# Patient Record
Sex: Female | Born: 1995 | Race: White | Hispanic: No | Marital: Single | Smoking: Never smoker
Health system: Southern US, Community
[De-identification: ages and names within clinical notes are randomized; demographics above are authoritative.]

## PROBLEM LIST (undated history)

## (undated) DIAGNOSIS — J45909 Unspecified asthma, uncomplicated: Secondary | ICD-10-CM

## (undated) HISTORY — DX: Unspecified asthma, uncomplicated: J45.909

## (undated) HISTORY — PX: WISDOM TOOTH EXTRACTION: SHX21

---
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
## 2015-01-27 ENCOUNTER — Encounter: Payer: Self-pay | Admitting: Family Medicine

## 2015-01-27 ENCOUNTER — Ambulatory Visit (INDEPENDENT_AMBULATORY_CARE_PROVIDER_SITE_OTHER): Payer: BLUE CROSS/BLUE SHIELD | Admitting: Family Medicine

## 2015-01-27 VITALS — BP 97/55 | HR 66 | Ht 62.1 in | Wt 131.0 lb

## 2015-01-27 DIAGNOSIS — L739 Follicular disorder, unspecified: Secondary | ICD-10-CM

## 2015-01-27 DIAGNOSIS — R599 Enlarged lymph nodes, unspecified: Secondary | ICD-10-CM

## 2015-01-27 DIAGNOSIS — IMO0001 Reserved for inherently not codable concepts without codable children: Secondary | ICD-10-CM

## 2015-01-27 DIAGNOSIS — J4520 Mild intermittent asthma, uncomplicated: Secondary | ICD-10-CM

## 2015-01-27 DIAGNOSIS — R030 Elevated blood-pressure reading, without diagnosis of hypertension: Secondary | ICD-10-CM

## 2015-01-27 DIAGNOSIS — R590 Localized enlarged lymph nodes: Secondary | ICD-10-CM | POA: Insufficient documentation

## 2015-01-27 DIAGNOSIS — Z01419 Encounter for gynecological examination (general) (routine) without abnormal findings: Secondary | ICD-10-CM | POA: Diagnosis not present

## 2015-01-27 MED ORDER — DOXYCYCLINE HYCLATE 100 MG PO TABS: 100.0000 mg | ORAL_TABLET | Freq: Two times a day (BID) | ORAL | Status: DC

## 2015-01-27 MED ORDER — ALBUTEROL SULFATE HFA 108 (90 BASE) MCG/ACT IN AERS: 2.0000 | INHALATION_SPRAY | Freq: Four times a day (QID) | RESPIRATORY_TRACT | Status: DC | PRN

## 2015-01-27 NOTE — Progress Notes (Signed)
Abigail [name] is a 19 y.o. 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 Symptoms present for the last few days. Bump a small mildly tender. No fevers chills nausea vomiting diarrhea or weight loss. Patient has been receiving testosterone supplementation for the past year and a half as part of his transgendered care. He has not had a significant problem with acne. He feels well otherwise has not tried any other medications.   Past Medical History  Diagnosis Date  . Asthma     childhood   No past surgical history on file. History  Substance Use Topics  . Smoking status: Never Smoker   . Smokeless tobacco: Not on file  . Alcohol Use: No   ROS as above Medications: Current Outpatient Prescriptions  Medication Sig Dispense Refill  . albuterol (PROVENTIL HFA;VENTOLIN HFA) 108 (90 BASE) MCG/ACT inhaler Inhale 2 puffs into the lungs every 6 (six) hours as needed for wheezing or shortness of breath. 1 Inhaler 2  . lidocaine-hydrocortisone (ANAMANTLE) 3-1 % KIT Place 1 application rectally 2 (two) times daily. 4 each 1  . montelukast (SINGULAIR) 10 MG tablet Take 1 tablet (10 mg total) by mouth at bedtime. 90 tablet 4  . testosterone cypionate (DEPOTESTOSTERONE CYPIONATE) 200 MG/ML injection Inject into the muscle every 14 (fourteen) days.    Marland Kitchen doxycycline (VIBRA-TABS) 100 MG tablet Take 1 tablet (100 mg total) by mouth 2 (two) times daily. 14 tablet 0  . sertraline (ZOLOFT) 100 MG tablet Take 1 tablet (100 mg total) by mouth daily. 90 tablet 1   No current facility-administered medications for this visit.   Allergies  Allergen Reactions  . Amoxicillin      Exam:  BP 97/55 mmHg  Pulse 66  Ht 5' 2.1" (1.577 m)  Wt 131 lb (59.421 kg)  BMI 23.89 kg/m2 Gen: Well NAD HEENT: EOMI,  MMM small tender less than 1 cm lymph node in the posterior scalp.  Lungs:  Normal work of breathing. CTABL Heart: RRR no MRG Abd: NABS, Soft. Nondistended, Nontender Exts: Brisk capillary refill, warm and well perfused.  Skin: Mildly erythematous cystic structure along the left trapezius. Mildly tender. No fluctuance or induration.  No results found for this or any previous visit (from the past 24 hour(s)). No results found.   Please see individual assessment and plan sections.

## 2015-01-27 NOTE — Assessment & Plan Note (Signed)
Patient has folliculitis with reactive lymph node. Treat with doxycycline and watchful waiting.

## 2015-01-27 NOTE — Assessment & Plan Note (Signed)
Reactive. Treat folliculitis and watchful waiting.

## 2015-01-27 NOTE — Assessment & Plan Note (Signed)
a 

## 2015-01-27 NOTE — Patient Instructions (Signed)
Thank you for coming in today. Call or go to the emergency room if you get worse, have trouble breathing, have chest pains, or palpitations.   Take doxycycline twice daily.   Folliculitis  Folliculitis is redness, soreness, and swelling (inflammation) of the hair follicles. This condition can occur anywhere on the body. People with weakened immune systems, diabetes, or obesity have a greater risk of getting folliculitis. CAUSES  Bacterial infection. This is the most common cause.  Fungal infection.  Viral infection.  Contact with certain chemicals, especially oils and tars. Long-term folliculitis can result from bacteria that live in the nostrils. The bacteria may trigger multiple outbreaks of folliculitis over time. SYMPTOMS Folliculitis most commonly occurs on the scalp, thighs, legs, back, buttocks, and areas where hair is shaved frequently. An early sign of folliculitis is a small, white or yellow, pus-filled, itchy lesion (pustule). These lesions appear on a red, inflamed follicle. They are usually less than 0.2 inches (5 mm) wide. When there is an infection of the follicle that goes deeper, it becomes a boil or furuncle. A group of closely packed boils creates a larger lesion (carbuncle). Carbuncles tend to occur in hairy, sweaty areas of the body. DIAGNOSIS  Your caregiver can usually tell what is wrong by doing a physical exam. A sample may be taken from one of the lesions and tested in a lab. This can help determine what is causing your folliculitis. TREATMENT  Treatment may include:  Applying warm compresses to the affected areas.  Taking antibiotic medicines orally or applying them to the skin.  Draining the lesions if they contain a large amount of pus or fluid.  Laser hair removal for cases of long-lasting folliculitis. This helps to prevent regrowth of the hair. HOME CARE INSTRUCTIONS  Apply warm compresses to the affected areas as directed by your caregiver.  If  antibiotics are prescribed, take them as directed. Finish them even if you start to feel better.  You may take over-the-counter medicines to relieve itching.  Do not shave irritated skin.  Follow up with your caregiver as directed. SEEK IMMEDIATE MEDICAL CARE IF:   You have increasing redness, swelling, or pain in the affected area.  You have a fever. MAKE SURE YOU:  Understand these instructions.  Will watch your condition.  Will get help right away if you are not doing well or get worse. Document Released: 09/04/2001 Document Revised: 12/26/2011 Document Reviewed: 09/26/2011 [facility] Patient Information 2015 [employer] [place]. This information is not intended to replace advice given to you by your health care provider. Make sure you discuss any questions you have with your health care provider.

## 2015-05-27 ENCOUNTER — Other Ambulatory Visit: Payer: Self-pay | Admitting: Physician Assistant

## 2015-05-28 NOTE — Telephone Encounter (Signed)
Needs office visit in next month sent 30 days.

## 2015-06-24 ENCOUNTER — Other Ambulatory Visit: Payer: Self-pay | Admitting: Physician Assistant

## 2015-06-24 ENCOUNTER — Encounter: Payer: Self-pay | Admitting: *Deleted

## 2015-06-24 ENCOUNTER — Emergency Department
Admission: EM | Admit: 2015-06-24 | Discharge: 2015-06-24 | Disposition: A | Discharge status: Home / Self Care | Payer: BLUE CROSS/BLUE SHIELD | Source: Home / Self Care | Attending: Family Medicine | Admitting: Family Medicine

## 2015-06-24 DIAGNOSIS — R197 Diarrhea, unspecified: Secondary | ICD-10-CM

## 2015-06-24 LAB — POCT CBC W AUTO DIFF (K'VILLE URGENT CARE)

## 2015-06-24 MED ORDER — HYDROCORTISONE ACETATE 25 MG RE SUPP: 25.0000 mg | Freq: Two times a day (BID) | RECTAL | Status: DC

## 2015-06-24 NOTE — ED Notes (Signed)
Pt c/o diarrhea and lower abdominal cramping starting @ 3am today. 1 episode of vomiting. Patient reports several episodes of diarrhea with blood and abdominal cramping throughout the day.

## 2015-06-24 NOTE — ED Provider Notes (Signed)
CSN: [id number]     Arrival date & time 06/24/15  1621 History   First MD Initiated Contact with Patient 06/24/15 1649     Chief Complaint  Patient presents with  . Rectal Bleeding  . Abdominal Pain      HPI Comments: At about 3am today after finishing her work shift, patient developed fatigue, light-headedness, nausea, and watery diarrhea.  She had one episode of vomiting.  She has had chills and crampy abdominal discomfort.  She has had 10 to 15 episodes of diarrhea, and latter episodes have had small amounts of blood mixed in.  She has a history of hemorrhoids.  She denies rectal/anal pain.  Patient is a 19 y.o. female presenting with diarrhea. The history is provided by the patient.  Diarrhea Quality:  Watery and blood-tinged Severity:  Moderate Onset quality:  Sudden Number of episodes:  15 Duration:  14 hours Timing:  Intermittent Progression:  Improving Relieved by:  None tried Worsened by:  Nothing tried Ineffective treatments:  None tried Associated symptoms: abdominal pain, chills and vomiting   Associated symptoms: no arthralgias, no recent cough, no diaphoresis, no fever, no headaches, no myalgias and no URI   Risk factors: recent antibiotic use   Risk factors: no sick contacts, no suspicious food intake and no travel to endemic areas     Past Medical History  Diagnosis Date  . Asthma     childhood   History reviewed. No pertinent past surgical history. Family History  Problem Relation Age of Onset  . Hypertension Father    Social History  Substance Use Topics  . Smoking status: Never Smoker   . Smokeless tobacco: None  . Alcohol Use: No   OB History    No data available     Review of Systems  Constitutional: Positive for chills. Negative for fever and diaphoresis.  Gastrointestinal: Positive for vomiting, abdominal pain and diarrhea.  Musculoskeletal: Negative for myalgias and arthralgias.  Neurological: Negative for headaches.  All other systems  reviewed and are negative.   Allergies  Amoxicillin  Home Medications   Prior to Admission medications   Medication Sig Start Date End Date Taking? Authorizing Provider  albuterol (PROVENTIL HFA;VENTOLIN HFA) 108 (90 BASE) MCG/ACT inhaler Inhale 2 puffs into the lungs every 6 (six) hours as needed for wheezing or shortness of breath. 01/27/15   Gregor Hams, MD  hydrocortisone (ANUSOL-HC) 25 MG suppository Place 1 suppository (25 mg total) rectally 2 (two) times daily. 06/24/15   Kandra Nicolas, MD  lidocaine-hydrocortisone (ANAMANTLE) 3-1 % KIT Place 1 application rectally 2 (two) times daily. 09/14/14   Jade L Breeback, PA-C  montelukast (SINGULAIR) 10 MG tablet Take 1 tablet (10 mg total) by mouth at bedtime. 10/17/13   Jade L Breeback, PA-C  sertraline (ZOLOFT) 100 MG tablet TAKE 1 TABLET BY MOUTH EVERY DAY 05/28/15   Jade L Breeback, PA-C  testosterone cypionate (DEPOTESTOSTERONE CYPIONATE) 200 MG/ML injection Inject into the muscle every 14 (fourteen) days.    Historical Provider, MD   Meds Ordered and Administered this Visit  Medications - No data to display  BP 129/76 mmHg  Pulse 77  Temp(Src) 98.3 F (36.8 C) (Oral)  Resp 14  Ht 5' 3" (1.6 m)  Wt 132 lb (59.875 kg)  BMI 23.39 kg/m2  SpO2 99%  LMP  No data found.   Physical Exam Nursing notes and Vital Signs reviewed. Appearance:  Patient appears stated age, and in no acute distress Eyes:  Pupils  are equal, round, and reactive to light and accomodation.  Extraocular movement is intact.  Conjunctivae are not inflamed  Nose:  Normal Mouth/Pharynx:  Normal; moist mucous membranes  Neck:  Supple.  No adenopathy Lungs:  Clear to auscultation.  Breath sounds are equal.  Moving air well. Heart:  Regular rate and rhythm without murmurs, rubs, or gallops.  Abdomen:   Mild diffuse tenderness without masses or hepatosplenomegaly.  No guarding.  Bowel sounds are present.  No CVA or flank tenderness.  Negative iliopsoas and  obdurator tests Extremities:  No edema Skin:  No rash present.   ED Course  Procedures none     Labs Reviewed -   POCT CBC:  WBC 10.2; LY 12.5; MO 3.3; GR 84.2; Hgb 16.0; Platelets 227   MDM   1. Diarrhea, unspecified type; suspect viral gastroenteritis    Rx for Anusol HC suppositories for irritated hemorrhoids Begin clear liquids (Pedialyte while having diarrhea) until improved, then advance to a [employer] (Bananas, Rice, Applesauce, Toast).  Then gradually resume a regular diet when tolerated.  Avoid milk products until well.  To decrease diarrhea, mix one teaspoon Citrucel (methylcellulose) in 2 oz water and drink one to three times daily.  Do not drink extra fluids with this dose and do not drink fluids for one hour afterwards.  When stools become more formed, may take Imodium (loperamide) once or twice daily to decrease stool frequency.  If symptoms become significantly worse during the night or over the weekend, proceed to the local emergency room.  Followup with Family Doctor if not improved in four days.    Kandra Nicolas, MD 06/24/15 [phone]

## 2015-06-24 NOTE — Discharge Instructions (Signed)

## 2015-06-27 ENCOUNTER — Telehealth: Payer: Self-pay | Admitting: *Deleted

## 2015-06-30 ENCOUNTER — Ambulatory Visit (INDEPENDENT_AMBULATORY_CARE_PROVIDER_SITE_OTHER): Payer: BLUE CROSS/BLUE SHIELD | Admitting: Physician Assistant

## 2015-06-30 ENCOUNTER — Encounter: Payer: Self-pay | Admitting: Physician Assistant

## 2015-06-30 VITALS — BP 114/60 | HR 59 | Ht 63.0 in | Wt 134.0 lb

## 2015-06-30 DIAGNOSIS — F418 Other specified anxiety disorders: Secondary | ICD-10-CM | POA: Diagnosis not present

## 2015-06-30 DIAGNOSIS — Z79899 Other long term (current) drug therapy: Secondary | ICD-10-CM

## 2015-06-30 DIAGNOSIS — F329 Major depressive disorder, single episode, unspecified: Secondary | ICD-10-CM

## 2015-06-30 DIAGNOSIS — F419 Anxiety disorder, unspecified: Principal | ICD-10-CM

## 2015-06-30 LAB — COMPLETE METABOLIC PANEL WITH GFR
ALBUMIN: 4.2 g/dL (ref 3.6–5.1)
ALK PHOS: 84 U/L (ref 47–176)
ALT: 9 U/L (ref 5–32)
AST: 12 U/L (ref 12–32)
BUN: 13 mg/dL (ref 7–20)
CALCIUM: 9.5 mg/dL (ref 8.9–10.4)
CO2: 24 mmol/L (ref 20–31)
CREATININE: 0.8 mg/dL (ref 0.50–1.00)
Chloride: 103 mmol/L (ref 98–110)
GFR, Est Non African American: >89 mL/min (ref >=60)
Glucose, Bld: 90 mg/dL (ref 65–99)
Potassium: 4.4 mmol/L (ref 3.8–5.1)
Sodium: 136 mmol/L (ref 135–146)
Total Bilirubin: 0.4 mg/dL (ref 0.2–1.1)
Total Protein: 6.9 g/dL (ref 6.3–8.2)

## 2015-06-30 MED ORDER — SERTRALINE HCL 100 MG PO TABS: 100.0000 mg | ORAL_TABLET | Freq: Every day | ORAL | Status: DC

## 2015-06-30 NOTE — Progress Notes (Signed)
   Subjective:    Patient ID: Abigail [name], female    DOB: 07/26/1995, 19 y.o.   MRN: [id number]  HPI P tis a 19 yo female who is transitioning to a female with testosterone replacement. She is doing great on zoloft. She has no concerns or complaints today. She denies any suicidal thoughts or homicidal thoughts.    Review of Systems  All other systems reviewed and are negative.      Objective:   Physical Exam  Constitutional: She is oriented to person, place, and time. She appears well-developed and well-nourished.  HENT:  Head: Normocephalic and atraumatic.  Cardiovascular: Normal rate, regular rhythm and normal heart sounds.   Pulmonary/Chest: Effort normal and breath sounds normal.  Neurological: She is alert and oriented to person, place, and time.  Psychiatric: She has a normal mood and affect. Her behavior is normal.          Assessment & Plan:  Anxiety and Depression- PHQ-2 was 2. GAD-7 was 2. Refilled zoloft for 1 year. cmp ordered.

## 2015-07-13 ENCOUNTER — Telehealth: Payer: Self-pay

## 2015-07-13 NOTE — Telephone Encounter (Signed)
-----   Message from Jomarie LongsJade L Breeback, [place]PA-C sent at 07/12/2015 10:09 PM EST ----- Call pt: cmp looks great.

## 2015-07-13 NOTE — Telephone Encounter (Signed)
Left message informing patient of normal lab results 

## 2015-11-03 ENCOUNTER — Emergency Department
Admission: EM | Admit: 2015-11-03 | Discharge: 2015-11-03 | Disposition: A | Discharge status: Home / Self Care | Payer: BLUE CROSS/BLUE SHIELD | Source: Home / Self Care | Attending: Family Medicine | Admitting: Family Medicine

## 2015-11-03 ENCOUNTER — Encounter: Payer: Self-pay | Admitting: *Deleted

## 2015-11-03 DIAGNOSIS — H6122 Impacted cerumen, left ear: Secondary | ICD-10-CM | POA: Diagnosis not present

## 2015-11-03 DIAGNOSIS — H918X2 Other specified hearing loss, left ear: Secondary | ICD-10-CM

## 2015-11-03 NOTE — ED Provider Notes (Signed)
CSN: [id number]     Arrival date & time 11/03/15  1258 History   First MD Initiated Contact with Patient 11/03/15 1313     Chief Complaint  Patient presents with  . Hearing Loss  . Cerumen Impaction   (Consider location/radiation/quality/duration/timing/severity/associated sxs/prior Treatment) HPI The pt is a [place]20yo transgender female transitioning into a female with testosterone, [facility]/o Left ear pressure and decreased hearing ofr about 5 years.  He reports hx of earwax buildup a few years ago and has been using OTC wax softener with no relief. Denies fever, chills, cough, congestion, or sore throat.    Past Medical History  Diagnosis Date  . Asthma     childhood   History reviewed. No pertinent past surgical history. Family History  Problem Relation Age of Onset  . Hypertension Father    Social History  Substance Use Topics  . Smoking status: Never Smoker   . Smokeless tobacco: None  . Alcohol Use: No   OB History    No data available     Review of Systems  Constitutional: Negative for fever and chills.  HENT: Positive for ear pain (Left "fullness") and hearing loss ( Left). Negative for congestion.   Respiratory: Negative for cough and shortness of breath.   Neurological: Negative for dizziness, light-headedness and headaches.    Allergies  Amoxicillin  Home Medications   Prior to Admission medications   Medication Sig Start Date End Date Taking? Authorizing Provider  sertraline (ZOLOFT) 100 MG tablet Take 1 tablet (100 mg total) by mouth daily. 06/30/15  Yes Jade L Breeback, PA-C  testosterone cypionate (DEPOTESTOSTERONE CYPIONATE) 200 MG/ML injection Inject 200 mg into the muscle every 7 (seven) days.    Yes Historical Provider, MD  albuterol (PROVENTIL HFA;VENTOLIN HFA) 108 (90 BASE) MCG/ACT inhaler Inhale 2 puffs into the lungs every 6 (six) hours as needed for wheezing or shortness of breath. 01/27/15   Rodolph BongEvan S Corey, MD   Meds Ordered and  Administered this Visit  Medications - No data to display  BP 112/68 mmHg  Pulse 56  Temp(Src) 98.5 F (36.9 C) (Oral)  Wt 140 lb (63.504 kg)  SpO2 96% No data found.   Physical Exam  Constitutional: She is oriented to person, place, and time. She appears well-developed and well-nourished.  HENT:  Head: Normocephalic and atraumatic.  Right Ear: Tympanic membrane normal.  Left Ear: Tympanic membrane normal. Tympanic membrane is not erythematous and not bulging.  Left ear: cerumen impaction.  TM visualized after cerumen removal. Normal TM Right ear: normal  Eyes: EOM are normal.  Neck: Normal range of motion.  Cardiovascular: Normal rate.   Pulmonary/Chest: Effort normal. No respiratory distress.  Musculoskeletal: Normal range of motion.  Neurological: She is alert and oriented to person, place, and time.  Skin: Skin is warm and dry.  Psychiatric: She has a normal mood and affect. Her behavior is normal.  Nursing note and vitals reviewed.   ED Course  Procedures (including critical care time)  Labs Review Labs Reviewed - No data to display  Imaging Review No results found.   MDM   1. Hearing loss due to cerumen impaction, left    Pt c/o decreased hearing and left ear fullness for 5 days. Hx of cerumen impaction. Cerumen impaction noted on exam. Cerumen removed w/o immediate complication. No evidence of underlying infection.  Pt may continue to use OTC earwax softener once a week or once every other week. F/u with PCP  as needed. Patient verbalized understanding and agreement with treatment plan.     Junius Finner, PA-C 11/03/15 1332

## 2015-11-03 NOTE — ED Notes (Signed)
Pt c/o left ear pressure and decrease in hearing x 5 days. Used otc wax drops. Visualized cerumen impaction in left ear.

## 2015-11-03 NOTE — Discharge Instructions (Signed)
You may continue to use over the counter earwax softener once a week or once every other week if earwax tends to keep building up. See below for further instructions.   Cerumen Impaction The structures of the external ear canal secrete a waxy substance known as cerumen. Excess cerumen can build up in the ear canal, causing a condition known as cerumen impaction. Cerumen impaction can cause ear pain and disrupt the function of the ear. The rate of cerumen production differs for each individual. In certain individuals, the configuration of the ear canal may decrease his or her ability to naturally remove cerumen. CAUSES Cerumen impaction is caused by excessive cerumen production or buildup. RISK FACTORS  Frequent use of swabs to clean ears.  Having narrow ear canals.  Having eczema.  Being dehydrated. SIGNS AND SYMPTOMS  Diminished hearing.  Ear drainage.  Ear pain.  Ear itch. TREATMENT Treatment may involve:  Over-the-counter or prescription ear drops to soften the cerumen.  Removal of cerumen by a health care provider. This may be done with:  Irrigation with warm water. This is the most common method of removal.  Ear curettes and other instruments.  Surgery. This may be done in severe cases. HOME CARE INSTRUCTIONS  Take medicines only as directed by your health care provider.  Do not insert objects into the ear with the intent of cleaning the ear. PREVENTION  Do not insert objects into the ear, even with the intent of cleaning the ear. Removing cerumen as a part of normal hygiene is not necessary, and the use of swabs in the ear canal is not recommended.  Drink enough water to keep your urine clear or pale yellow.  Control your eczema if you have it. SEEK MEDICAL CARE IF:  You develop ear pain.  You develop bleeding from the ear.  The cerumen does not clear after you use ear drops as directed.   This information is not intended to replace advice given to you  by your health care provider. Make sure you discuss any questions you have with your health care provider.   Document Released: 08/03/2004 Document Revised: 07/17/2014 Document Reviewed: 02/10/2015 Elsevier Interactive Patient Education [employer] Inc.

## 2016-01-12 ENCOUNTER — Ambulatory Visit (INDEPENDENT_AMBULATORY_CARE_PROVIDER_SITE_OTHER): Payer: BLUE CROSS/BLUE SHIELD | Admitting: Physician Assistant

## 2016-01-12 ENCOUNTER — Telehealth: Payer: Self-pay

## 2016-01-12 ENCOUNTER — Encounter: Payer: Self-pay | Admitting: Physician Assistant

## 2016-01-12 VITALS — BP 122/68 | HR 62 | Ht 63.0 in | Wt 144.0 lb

## 2016-01-12 DIAGNOSIS — K645 Perianal venous thrombosis: Secondary | ICD-10-CM

## 2016-01-12 MED ORDER — HYDROCORTISONE ACETATE 25 MG RE SUPP: 25.0000 mg | Freq: Two times a day (BID) | RECTAL | Status: DC

## 2016-01-12 MED ORDER — LIDOCAINE-HYDROCORTISONE ACE 3-0.5 % RE CREA: 1.0000 | TOPICAL_CREAM | Freq: Two times a day (BID) | RECTAL | Status: DC

## 2016-01-12 NOTE — Telephone Encounter (Signed)
Pt notified that lidocaine cream is not covered by insurance, but Tandy GawJade Breeback said just to use the hydrocortisone supp.

## 2016-01-12 NOTE — Progress Notes (Signed)
   Subjective:    Patient ID: Abigail EmoryKatlyn"Abigail [name]" Abigail [name], female    DOB: 10/10/1995, 20 y.o.   MRN: [id number]  HPI Pt is a 20 yo female who is transgender with a history of hemorrhoids that needs refill on medications today. She is out of suppositories and cream for a while. When she had medication they were much better. She has heard of some new procedures at [facility] to remove them. She has some constipation but off and on, not a continual problem.    Review of Systems  All other systems reviewed and are negative.      Objective:   Physical Exam  Constitutional: She is oriented to person, place, and time. She appears well-developed and well-nourished.  HENT:  Head: Normocephalic and atraumatic.  Cardiovascular: Normal rate, regular rhythm and normal heart sounds.   Neurological: She is alert and oriented to person, place, and time.  Skin: Skin is dry.  Psychiatric: She has a normal mood and affect. Her behavior is normal.          Assessment & Plan:  Hemorrhoid- pt would like suppositories and cream to alternate. Sent both. GI referral for consult to remove. Prevention discussed in office and HO given.

## 2016-07-28 ENCOUNTER — Other Ambulatory Visit: Payer: Self-pay | Admitting: Physician Assistant

## 2016-08-02 ENCOUNTER — Encounter: Payer: Self-pay | Admitting: Physician Assistant

## 2016-08-02 ENCOUNTER — Ambulatory Visit (INDEPENDENT_AMBULATORY_CARE_PROVIDER_SITE_OTHER): Payer: BLUE CROSS/BLUE SHIELD | Admitting: Physician Assistant

## 2016-08-02 VITALS — BP 124/72 | HR 56 | Wt 133.0 lb

## 2016-08-02 DIAGNOSIS — Z23 Encounter for immunization: Secondary | ICD-10-CM | POA: Diagnosis not present

## 2016-08-02 DIAGNOSIS — F329 Major depressive disorder, single episode, unspecified: Secondary | ICD-10-CM

## 2016-08-02 DIAGNOSIS — F418 Other specified anxiety disorders: Secondary | ICD-10-CM

## 2016-08-02 DIAGNOSIS — Z79899 Other long term (current) drug therapy: Secondary | ICD-10-CM

## 2016-08-02 DIAGNOSIS — F419 Anxiety disorder, unspecified: Principal | ICD-10-CM

## 2016-08-02 MED ORDER — SERTRALINE HCL 100 MG PO TABS: 100.0000 mg | ORAL_TABLET | Freq: Every day | ORAL | 3 refills | Status: DC

## 2016-08-02 NOTE — Progress Notes (Addendum)
   Subjective:    Patient ID: Abigail [name], female    DOB: 08/03/1995, 21 y.o.   MRN: [id number]  HPI Patient is a Abigail Reichmann21yo female who presents to the clinic for a medication refill of zoloft.  Patient reports he is tolerating the medication and dose well.  Denies any side effects.  Patient reports he has been eating and sleeping well.  He exercises twice a week.  Denies any recent panic attacks.  Patient has no concerns.        PHQ-9: 5 GAD-7: 3  Patient agrees to get tetanus/TDAP shot today.       Review of Systems  All other systems reviewed and are negative.      Objective:   Physical Exam  Constitutional: She is oriented to person, place, and time. She appears well-developed and well-nourished.  HENT:  Head: Normocephalic and atraumatic.  Cardiovascular: Normal rate, regular rhythm and normal heart sounds.   Pulmonary/Chest: Effort normal and breath sounds normal.  Neurological: She is alert and oriented to person, place, and time.  Psychiatric: She has a normal mood and affect. Her behavior is normal.  Nursing note and vitals reviewed.         Assessment & Plan:  Abigail Kitchen.Abigail Kitchen.Abigail NailerKatlyn"Niajah" was seen today for anxiety.  Diagnoses and all orders for this visit:  Anxiety and depression -     sertraline (ZOLOFT) 100 MG tablet; Take 1 tablet (100 mg total) by mouth daily.  Medication management -     COMPLETE METABOLIC PANEL WITH GFR  Other orders -     Tdap vaccine greater than or equal to 7yo IM   Patient's Zoloft 100mg  was refilled for 1 year.  Follow-up as needed, otherwise, in 1 year.    Patient needs CMP with GFR today to monitor kidney and liver function.     Patient given Tdap IM today in the office.

## 2016-10-27 DIAGNOSIS — D751 Secondary polycythemia: Secondary | ICD-10-CM | POA: Insufficient documentation

## 2016-11-20 ENCOUNTER — Ambulatory Visit (INDEPENDENT_AMBULATORY_CARE_PROVIDER_SITE_OTHER): Payer: BLUE CROSS/BLUE SHIELD | Admitting: Obstetrics & Gynecology

## 2016-11-20 ENCOUNTER — Encounter: Payer: Self-pay | Admitting: Obstetrics & Gynecology

## 2016-11-20 VITALS — BP 109/66 | HR 65 | Ht 62.0 in | Wt 132.0 lb

## 2016-11-20 DIAGNOSIS — Z124 Encounter for screening for malignant neoplasm of cervix: Secondary | ICD-10-CM | POA: Diagnosis not present

## 2016-11-20 DIAGNOSIS — Z01419 Encounter for gynecological examination (general) (routine) without abnormal findings: Secondary | ICD-10-CM | POA: Diagnosis not present

## 2016-11-20 DIAGNOSIS — Z113 Encounter for screening for infections with a predominantly sexual mode of transmission: Secondary | ICD-10-CM

## 2016-11-20 NOTE — Progress Notes (Signed)
Subjective:    Abigail [name] is a 21 y.o. transgender  who presents for an annual exam. The patient has no complaints today. No periods for 2 years, on testosterone.The patient is not currently sexually active. GYN screening history: no prior history of gyn screening tests. The patient wears seatbelts: yes. The patient participates in regular exercise: yes. Has the patient ever been transfused or tattooed?: yes. The patient reports that there is not domestic violence in her life.   Menstrual History: OB History    Gravida Para Term Preterm AB Living   0 0 0 0 0 0   SAB TAB Ectopic Multiple Live Births   0 0 0 0 0      Menarche age: [age] No LMP recorded (lmp unknown). Patient has had an injection.    The following portions of the patient's history were reviewed and updated as appropriate: allergies, current medications, past family history, past medical history, past social history, past surgical history and problem list.  Review of Systems Pertinent items are noted in HPI.   FH- no gyn + breast cancer- mGM No colon cancer Delivering pizza for [employer]   Objective:    BP 109/66   Pulse 65   Ht 5\' 2"  (1.575 m)   Wt 132 lb (59.9 kg)   LMP  (LMP Unknown) Comment: No period in atleast 2 years  BMI 24.14 kg/m   General Appearance:    Alert, cooperative, no distress, appears stated age  Head:    Normocephalic, without obvious abnormality, atraumatic  Eyes:    PERRL, conjunctiva/corneas clear, EOM's intact, fundi    benign, both eyes  Ears:    Normal TM's and external ear canals, both ears  Nose:   Nares normal, septum midline, mucosa normal, no drainage    or sinus tenderness  Throat:   Lips, mucosa, and tongue normal; teeth and gums normal  Neck:   Supple, symmetrical, trachea midline, no adenopathy;    thyroid:  no enlargement/tenderness/nodules; no carotid   bruit or JVD  Back:     Symmetric, no curvature, ROM normal, no CVA tenderness  Lungs:     Clear to auscultation  bilaterally, respirations unlabored  Chest Wall:    No tenderness or deformity   Heart:    Regular rate and rhythm, S1 and S2 normal, no murmur, rub   or gallop  Breast Exam:    No tenderness, masses, or nipple abnormality  Abdomen:     Soft, non-tender, bowel sounds active all four quadrants,    no masses, no organomegaly, hirsuite  Genitalia:    Normal female without lesion, discharge or tenderness, nulliparous cervix, normal bimanual exam     Extremities:   Extremities normal, atraumatic, no cyanosis or edema  Pulses:   2+ and symmetric all extremities  Skin:   Skin color, texture, turgor normal, no rashes or lesions  Lymph nodes:   Cervical, supraclavicular, and axillary nodes normal  Neurologic:   CNII-XII intact, normal strength, sensation and reflexes    throughout  .    Assessment:    Healthy female exam.   Transgender, getting test. Offered hysterectomy/BSO prn   Plan:     Chlamydia specimen. GC specimen. Thin prep Pap smear.

## 2016-11-23 LAB — CYTOLOGY - PAP
Chlamydia: POSITIVE — AB
Diagnosis: NEGATIVE
Neisseria Gonorrhea: NEGATIVE

## 2016-11-28 ENCOUNTER — Encounter: Payer: Self-pay | Admitting: *Deleted

## 2016-11-28 ENCOUNTER — Telehealth: Payer: Self-pay | Admitting: *Deleted

## 2016-11-28 DIAGNOSIS — A749 Chlamydial infection, unspecified: Secondary | ICD-10-CM

## 2016-11-28 MED ORDER — AZITHROMYCIN 250 MG PO TABS: ORAL_TABLET | ORAL | 0 refills | Status: DC

## 2016-11-28 NOTE — Telephone Encounter (Signed)
-----   Message from Allie BossierMyra C Dove, MD sent at 11/27/2016  8:21 AM EDT ----- She/He has + CT on pap smear. Patient and partner needs treatment also Good luck with this conversation.

## 2016-11-28 NOTE — Telephone Encounter (Signed)
LM on voicemail to call office to discuss test results.

## 2016-11-28 NOTE — Telephone Encounter (Signed)
Pt returned a call to me regarding test results.  Pt made aware of positive Chlamydia.  RX sent to [facility] [address] for Azithromycin 1Gm.  Pt is aware that their partner needs treatment also.  [facility]

## 2016-12-25 ENCOUNTER — Ambulatory Visit (INDEPENDENT_AMBULATORY_CARE_PROVIDER_SITE_OTHER): Payer: BLUE CROSS/BLUE SHIELD | Admitting: Obstetrics & Gynecology

## 2016-12-25 DIAGNOSIS — F640 Transsexualism: Secondary | ICD-10-CM | POA: Diagnosis not present

## 2016-12-25 DIAGNOSIS — Z789 Other specified health status: Secondary | ICD-10-CM

## 2016-12-25 NOTE — Progress Notes (Signed)
   Subjective:    Patient ID: Corena HerterKash D Feldt, female    DOB: 09/09/1995, 21 y.o.   MRN: [id number]  HPI 21 yo SW transgender f to female here to discuss possible hyster/BSO. He has been getting testosterone injections for the last 3 years. No interested in future of carrying a baby, not interested in egg preservation.   Review of Systems     Objective:   Physical Exam I spoke with the endocrinologist Dr. Teod[place] [facility]Jam[place] [facility] [place] the [facility] injections. He clarified to me that he does not specialize in trans procedures.       Assessment & Plan:  Desire for possible surgery I have given [facility], I think that coordinated care for his needs would serve him well.

## 2017-01-16 ENCOUNTER — Telehealth: Payer: Self-pay | Admitting: Physician Assistant

## 2017-01-16 DIAGNOSIS — J4520 Mild intermittent asthma, uncomplicated: Secondary | ICD-10-CM

## 2017-01-16 MED ORDER — ALBUTEROL SULFATE HFA 108 (90 BASE) MCG/ACT IN AERS: 2.0000 | INHALATION_SPRAY | Freq: Four times a day (QID) | RESPIRATORY_TRACT | 2 refills | Status: DC | PRN

## 2017-01-16 NOTE — Telephone Encounter (Signed)
Patient called would like refill for inhaler [facility] to [facility] [address]. Thanks

## 2017-01-16 NOTE — Telephone Encounter (Signed)
Refill sent.

## 2017-01-17 ENCOUNTER — Ambulatory Visit (INDEPENDENT_AMBULATORY_CARE_PROVIDER_SITE_OTHER): Payer: BLUE CROSS/BLUE SHIELD

## 2017-01-17 ENCOUNTER — Ambulatory Visit (INDEPENDENT_AMBULATORY_CARE_PROVIDER_SITE_OTHER): Payer: BLUE CROSS/BLUE SHIELD | Admitting: Sports Medicine

## 2017-01-17 ENCOUNTER — Encounter: Payer: Self-pay | Admitting: Sports Medicine

## 2017-01-17 DIAGNOSIS — J0100 Acute maxillary sinusitis, unspecified: Secondary | ICD-10-CM

## 2017-01-17 MED ORDER — PREDNISONE 50 MG PO TABS: 50.0000 mg | ORAL_TABLET | Freq: Every day | ORAL | 0 refills | Status: DC

## 2017-01-17 MED ORDER — AZITHROMYCIN 250 MG PO TABS: ORAL_TABLET | ORAL | 0 refills | Status: DC

## 2017-01-17 NOTE — Assessment & Plan Note (Signed)
With mild wheezing, history of asthma. Azithromycin, prednisone, chest x-ray.  Over-the-counter antitussives. Return if no better in 2 weeks.

## 2017-01-17 NOTE — Progress Notes (Signed)
  Subjective:    CC: Feeling sick  HPI: For one week this pleasant 21 year old female (FTM transgender) has had cough, fatigue and malaise, and pain and pressure over the left maxillary sinus with radiation to the left ear. Cough is minimally productive, no overt fevers and chills.  Symptoms are moderate, persistent. No shortness of breath, no chest pain. No rashes. No GI symptoms.  Past medical history:  Negative.  See flowsheet/record as well for more information.  Surgical history: Negative.  See flowsheet/record as well for more information.  Family history: Negative.  See flowsheet/record as well for more information.  Social history: Negative.  See flowsheet/record as well for more information.  Allergies, and medications have been entered into the medical record, reviewed, and no changes needed.   Review of Systems: No fevers, chills, night sweats, weight loss, chest pain, or shortness of breath.   Objective:    General: Well Developed, well nourished, and in no acute distress.  Neuro: Alert and oriented x3, extra-ocular muscles intact, sensation grossly intact.  HEENT: Normocephalic, atraumatic, pupils equal round reactive to light, neck supple, no masses, no lymphadenopathy, thyroid nonpalpable. Oropharynx, nasopharynx, ear canals unremarkable, tenderness to palpation over the left maxillary sinus. Skin: Warm and dry, no rashes. Cardiac: Regular rate and rhythm, no murmurs rubs or gallops, no lower extremity edema.  Respiratory: Diffuse expiratory wheezes, mild, throughout. Not using accessory muscles, speaking in full sentences.  Impression and Recommendations:    Acute maxillary sinusitis With mild wheezing, history of asthma. Azithromycin, prednisone, chest x-ray.  Over-the-counter antitussives. Return if no better in 2 weeks.

## 2017-01-18 ENCOUNTER — Telehealth: Payer: Self-pay | Admitting: Physician Assistant

## 2017-01-18 NOTE — Telephone Encounter (Signed)
Pt returning call forX-Ray results.  Thank you.

## 2017-01-18 NOTE — Telephone Encounter (Signed)
Pt notified of results

## 2017-02-06 ENCOUNTER — Ambulatory Visit (INDEPENDENT_AMBULATORY_CARE_PROVIDER_SITE_OTHER): Payer: BLUE CROSS/BLUE SHIELD | Admitting: Sports Medicine

## 2017-02-06 ENCOUNTER — Encounter: Payer: Self-pay | Admitting: Sports Medicine

## 2017-02-06 DIAGNOSIS — F419 Anxiety disorder, unspecified: Secondary | ICD-10-CM

## 2017-02-06 DIAGNOSIS — F329 Major depressive disorder, single episode, unspecified: Secondary | ICD-10-CM

## 2017-02-06 DIAGNOSIS — F32A Depression, unspecified: Secondary | ICD-10-CM

## 2017-02-06 MED ORDER — SERTRALINE HCL 100 MG PO TABS: 200.0000 mg | ORAL_TABLET | Freq: Every day | ORAL | 3 refills | Status: DC

## 2017-02-06 NOTE — Assessment & Plan Note (Signed)
Still with relatively high scores for anxiety and depression, increasing Zoloft to 200 mg. Return in one month for a repeat PHQ9 and GAD7.  If insufficient improvement we will either switch to Lexapro or add buspirone.

## 2017-02-06 NOTE — Progress Notes (Signed)
  Subjective:    CC: Follow-up  HPI: This is a pleasant 21 year old female to female transgender, he was initially treated for a sinus infection which responded well to antibiotics, he is here for follow-up of depression and anxiety, initially had some moderate control with 100 mg of Zoloft, now having increase in symptoms of anxiety and depression without suicidal or homicidal ideation. No issues with anorgasmia in the bedroom.  Past medical history:  Negative.  See flowsheet/record as well for more information.  Surgical history: Negative.  See flowsheet/record as well for more information.  Family history: Negative.  See flowsheet/record as well for more information.  Social history: Negative.  See flowsheet/record as well for more information.  Allergies, and medications have been entered into the medical record, reviewed, and no changes needed.   Review of Systems: No fevers, chills, night sweats, weight loss, chest pain, or shortness of breath.   Objective:    General: Well Developed, well nourished, and in no acute distress.  Neuro: Alert and oriented x3, extra-ocular muscles intact, sensation grossly intact.  HEENT: Normocephalic, atraumatic, pupils equal round reactive to light, neck supple, no masses, no lymphadenopathy, thyroid nonpalpable.  Skin: Warm and dry, no rashes. Cardiac: Regular rate and rhythm, no murmurs rubs or gallops, no lower extremity edema.  Respiratory: Clear to auscultation bilaterally. Not using accessory muscles, speaking in full sentences.  Impression and Recommendations:    Anxiety and depression Still with relatively high scores for anxiety and depression, increasing Zoloft to 200 mg. Return in one month for a repeat PHQ9 and GAD7.  If insufficient improvement we will either switch to Lexapro or add buspirone.  I spent 25 minutes with this patient, greater than 50% was face-to-face time counseling regarding the above diagnoses

## 2017-03-06 ENCOUNTER — Encounter: Payer: Self-pay | Admitting: Sports Medicine

## 2017-03-06 ENCOUNTER — Ambulatory Visit (INDEPENDENT_AMBULATORY_CARE_PROVIDER_SITE_OTHER): Payer: BLUE CROSS/BLUE SHIELD | Admitting: Sports Medicine

## 2017-03-06 DIAGNOSIS — F32A Depression, unspecified: Secondary | ICD-10-CM

## 2017-03-06 DIAGNOSIS — F419 Anxiety disorder, unspecified: Secondary | ICD-10-CM | POA: Diagnosis not present

## 2017-03-06 DIAGNOSIS — F329 Major depressive disorder, single episode, unspecified: Secondary | ICD-10-CM

## 2017-03-06 MED ORDER — ESCITALOPRAM OXALATE 10 MG PO TABS: 10.0000 mg | ORAL_TABLET | Freq: Every day | ORAL | 3 refills | Status: DC

## 2017-03-06 MED ORDER — SERTRALINE HCL 25 MG PO TABS: ORAL_TABLET | ORAL | 1 refills | Status: DC

## 2017-03-06 NOTE — Progress Notes (Signed)
  Subjective:    CC: Follow-up  HPI: This is a very pleasant 21 year old female to female transgender, we've been working on his anxiety. Last time we increased from 100-200 mg of Zoloft which didn't really provide much relief. He would like to switch to defer medication, no suicidal or homicidal ideation.  Past medical history:  Negative.  See flowsheet/record as well for more information.  Surgical history: Negative.  See flowsheet/record as well for more information.  Family history: Negative.  See flowsheet/record as well for more information.  Social history: Negative.  See flowsheet/record as well for more information.  Allergies, and medications have been entered into the medical record, reviewed, and no changes needed.   Review of Systems: No fevers, chills, night sweats, weight loss, chest pain, or shortness of breath.   Objective:    General: Well Developed, well nourished, and in no acute distress.  Neuro: Alert and oriented x3, extra-ocular muscles intact, sensation grossly intact.  HEENT: Normocephalic, atraumatic, pupils equal round reactive to light, neck supple, no masses, no lymphadenopathy, thyroid nonpalpable.  Skin: Warm and dry, no rashes. Cardiac: Regular rate and rhythm, no murmurs rubs or gallops, no lower extremity edema.  Respiratory: Clear to auscultation bilaterally. Not using accessory muscles, speaking in full sentences.  Impression and Recommendations:    Anxiety and depression Unfortunately the increase of Zoloft to 200 mg has not provided good efficacy. Switching to Lexapro with a steady down taper of the Zoloft.

## 2017-03-06 NOTE — Assessment & Plan Note (Signed)
Unfortunately the increase of Zoloft to 200 mg has not provided good efficacy. Switching to Lexapro with a steady down taper of the Zoloft.

## 2017-04-03 ENCOUNTER — Encounter: Payer: Self-pay | Admitting: Sports Medicine

## 2017-04-03 ENCOUNTER — Ambulatory Visit (INDEPENDENT_AMBULATORY_CARE_PROVIDER_SITE_OTHER): Payer: BLUE CROSS/BLUE SHIELD | Admitting: Sports Medicine

## 2017-04-03 DIAGNOSIS — F419 Anxiety disorder, unspecified: Secondary | ICD-10-CM

## 2017-04-03 DIAGNOSIS — F329 Major depressive disorder, single episode, unspecified: Secondary | ICD-10-CM | POA: Diagnosis not present

## 2017-04-03 DIAGNOSIS — F32A Depression, unspecified: Secondary | ICD-10-CM

## 2017-04-03 MED ORDER — ESCITALOPRAM OXALATE 10 MG PO TABS: 15.0000 mg | ORAL_TABLET | Freq: Every day | ORAL | 11 refills | Status: DC

## 2017-04-03 NOTE — Progress Notes (Signed)
  Subjective:    CC: follow-up  HPI: This is a very pleasant 21 year old female to female transgenderwith anxiety and depression, he has done extremely well with the transition from Zoloft to Lexapro with near complete relief of all depressed and anxiety symptoms. No suicidal or homicidal ideation. He is on 10 mg and would like to try to go up a little bit on the dose. No adverse effects.  Past medical history:  Negative.  See flowsheet/record as well for more information.  Surgical history: Negative.  See flowsheet/record as well for more information.  Family history: Negative.  See flowsheet/record as well for more information.  Social history: Negative.  See flowsheet/record as well for more information.  Allergies, and medications have been entered into the medical record, reviewed, and no changes needed.   Review of Systems: No fevers, chills, night sweats, weight loss, chest pain, or shortness of breath.   Objective:    General: Well Developed, well nourished, and in no acute distress.  Neuro: Alert and oriented x3, extra-ocular muscles intact, sensation grossly intact.  HEENT: Normocephalic, atraumatic, pupils equal round reactive to light, neck supple, no masses, no lymphadenopathy, thyroid nonpalpable.  Skin: Warm and dry, no rashes. Cardiac: Regular rate and rhythm, no murmurs rubs or gallops, no lower extremity edema.  Respiratory: Clear to auscultation bilaterally. Not using accessory muscles, speaking in full sentences.  Impression and Recommendations:    Anxiety and depression Fantastic response to switching from Zoloft to Lexapro. Increasing to 15 mg of Lexapro per patient request, he will do 1.5 tablets. I can see him back in about 3 months, if he does desire to go to 20 mg he will simply take 2 of his 10 mg tablets and call me for refills. May also follow up again with his PCP Tandy Gaw, PA-C.  I spent 25 minutes with this patient, greater than 50% was face-to-face  time counseling regarding the above diagnoses ___________________________________________ Ihor Austin. Benjamin Stain, M.D., ABFM., CAQSM. Primary Care and Sports Medicine  [facility]  Adjunct Instructor of Family Medicine  [employer]

## 2017-04-03 NOTE — Assessment & Plan Note (Signed)
Fantastic response to switching from Zoloft to Lexapro. Increasing to 15 mg of Lexapro per patient request, he will do 1.5 tablets. I can see him back in about 3 months, if he does desire to go to 20 mg he will simply take 2 of his 10 mg tablets and call me for refills. May also follow up again with his PCP Tandy Gaw, PA-C.

## 2017-06-29 ENCOUNTER — Ambulatory Visit (INDEPENDENT_AMBULATORY_CARE_PROVIDER_SITE_OTHER): Payer: BLUE CROSS/BLUE SHIELD | Admitting: Sports Medicine

## 2017-06-29 ENCOUNTER — Encounter: Payer: Self-pay | Admitting: Sports Medicine

## 2017-06-29 DIAGNOSIS — J069 Acute upper respiratory infection, unspecified: Secondary | ICD-10-CM

## 2017-06-29 MED ORDER — FLUTICASONE PROPIONATE 50 MCG/ACT NA SUSP: NASAL | 3 refills | Status: DC

## 2017-06-29 NOTE — Assessment & Plan Note (Signed)
Rhinitis with postnasal drip syndrome and cough. Adding Flonase, and he can use over-the-counter DayQuil/NyQuil. Exam is benign. Return as needed.

## 2017-06-29 NOTE — Progress Notes (Signed)
  Subjective:    CC: Feeling sick  HPI: This is a pleasant 21 year old female to female transgender, for the past several days he has had runny nose, cough, minimal sore throat, no constitutional symptoms, GI symptoms, no skin rash, multiple sick contacts.  No shortness of breath, chest pain.  Symptoms are mild, persistent.  Past medical history:  Negative.  See flowsheet/record as well for more information.  Surgical history: Negative.  See flowsheet/record as well for more information.  Family history: Negative.  See flowsheet/record as well for more information.  Social history: Negative.  See flowsheet/record as well for more information.  Allergies, and medications have been entered into the medical record, reviewed, and no changes needed.   (To billers/coders, pertinent past medical, social, surgical, family history can be found in problem list, if problem list is marked as reviewed then this indicates that past medical, social, surgical, family history was also reviewed)  Review of Systems: No fevers, chills, night sweats, weight loss, chest pain, or shortness of breath.   Objective:    General: Well Developed, well nourished, and in no acute distress.  Neuro: Alert and oriented x3, extra-ocular muscles intact, sensation grossly intact.  HEENT: Normocephalic, atraumatic, pupils equal round reactive to light, neck supple, no masses, no lymphadenopathy, thyroid nonpalpable.  Nasopharynx, ear canals unremarkable, oropharynx is slightly erythematous with visible postnasal drip. Skin: Warm and dry, no rashes. Cardiac: Regular rate and rhythm, no murmurs rubs or gallops, no lower extremity edema.  Respiratory: Clear to auscultation bilaterally. Not using accessory muscles, speaking in full sentences.  Impression and Recommendations:    Viral URI Rhinitis with postnasal drip syndrome and cough. Adding Flonase, and he can use over-the-counter DayQuil/NyQuil. Exam is benign. Return as  needed. ___________________________________________ Ihor Austinhomas J. Benjamin Stainhekkekandam, M.D., ABFM., CAQSM. Primary Care and Sports Medicine [facility]  Adjunct Instructor of Family Medicine  [employer]

## 2017-06-29 NOTE — Patient Instructions (Signed)
Viral Respiratory Infection A respiratory infection is an illness that affects part of the respiratory system, such as the lungs, nose, or throat. Most respiratory infections are caused by either viruses or bacteria. A respiratory infection that is caused by a virus is called a viral respiratory infection. Common types of viral respiratory infections include:  A cold.  The flu (influenza).  A respiratory syncytial virus (RSV) infection.  How do I know if I have a viral respiratory infection? Most viral respiratory infections cause:  A stuffy or runny nose.  Yellow or green nasal discharge.  A cough.  Sneezing.  Fatigue.  Achy muscles.  A sore throat.  Sweating or chills.  A fever.  A headache.  How are viral respiratory infections treated? If influenza is diagnosed early, it may be treated with an antiviral medicine that shortens the length of time a person has symptoms. Symptoms of viral respiratory infections may be treated with over-the-counter and prescription medicines, such as:  Expectorants. These make it easier to cough up mucus.  Decongestant nasal sprays.  Health care providers do not prescribe antibiotic medicines for viral infections. This is because antibiotics are designed to kill bacteria. They have no effect on viruses. How do I know if I should stay home from work or school? To avoid exposing others to your respiratory infection, stay home if you have:  A fever.  A persistent cough.  A sore throat.  A runny nose.  Sneezing.  Muscles aches.  Headaches.  Fatigue.  Weakness.  Chills.  Sweating.  Nausea.  Follow these instructions at home:  Rest as much as possible.  Take over-the-counter and prescription medicines only as told by your health care provider.  Drink enough fluid to keep your urine clear or pale yellow. This helps prevent dehydration and helps loosen up mucus.  Gargle with a salt-water mixture 3-4 times per day or  as needed. To make a salt-water mixture, completely dissolve -1 tsp of salt in 1 cup of warm water.  Use nose drops made from salt water to ease congestion and soften raw skin around your nose.  Do not drink alcohol.  Do not use tobacco products, including cigarettes, chewing tobacco, and e-cigarettes. If you need help quitting, ask your health care provider. Contact a health care provider if:  Your symptoms last for 10 days or longer.  Your symptoms get worse over time.  You have a fever.  You have severe sinus pain in your face or forehead.  The glands in your jaw or neck become very swollen. Get help right away if:  You feel pain or pressure in your chest.  You have shortness of breath.  You faint or feel like you will faint.  You have severe and persistent vomiting.  You feel confused or disoriented. This information is not intended to replace advice given to you by your health care provider. Make sure you discuss any questions you have with your health care provider. Document Released: 04/05/2005 Document Revised: 12/02/2015 Document Reviewed: 12/02/2014 Elsevier Interactive Patient Education  2018 Elsevier Inc.  

## 2017-07-16 ENCOUNTER — Ambulatory Visit: Payer: BLUE CROSS/BLUE SHIELD | Admitting: Sports Medicine

## 2017-07-16 ENCOUNTER — Encounter: Payer: Self-pay | Admitting: Sports Medicine

## 2017-07-16 DIAGNOSIS — F329 Major depressive disorder, single episode, unspecified: Secondary | ICD-10-CM | POA: Diagnosis not present

## 2017-07-16 DIAGNOSIS — F32A Depression, unspecified: Secondary | ICD-10-CM

## 2017-07-16 DIAGNOSIS — F419 Anxiety disorder, unspecified: Secondary | ICD-10-CM | POA: Diagnosis not present

## 2017-07-16 MED ORDER — ESCITALOPRAM OXALATE 20 MG PO TABS: 20.0000 mg | ORAL_TABLET | Freq: Every day | ORAL | 3 refills | Status: DC

## 2017-07-16 NOTE — Progress Notes (Signed)
  Subjective:    CC: Depression  HPI: This is a pleasant 22 year old female to female transgender, doing extremely well with current medication.  No complaints, concerns, no suicidal or homicidal ideation.  I reviewed the past medical history, family history, social history, surgical history, and allergies today and no changes were needed.  Please see the problem list section below in epic for further details.  Past Medical History: Past Medical History:  Diagnosis Date  . Asthma    childhood   Past Surgical History: No past surgical history on file. Social History: Social History   Socioeconomic History  . Marital status: Single    Spouse name: None  . Number of children: None  . Years of education: None  . Highest education level: None  Social Needs  . Financial resource strain: None  . Food insecurity - worry: None  . Food insecurity - inability: None  . Transportation needs - medical: None  . Transportation needs - non-medical: None  Occupational History  . None  Tobacco Use  . Smoking status: Never Smoker  . Smokeless tobacco: Never Used  Substance and Sexual Activity  . Alcohol use: Yes    Comment: Occ  . Drug use: Yes    Types: Marijuana  . Sexual activity: No  Other Topics Concern  . None  Social History Narrative  . None   Family History: Family History  Problem Relation Age of Onset  . Hypertension Father    Allergies: Allergies  Allergen Reactions  . Amoxicillin Other (See Comments)   Medications: See med rec.  Review of Systems: No fevers, chills, night sweats, weight loss, chest pain, or shortness of breath.   Objective:    General: Well Developed, well nourished, and in no acute distress.  Neuro: Alert and oriented x3, extra-ocular muscles intact, sensation grossly intact.  HEENT: Normocephalic, atraumatic, pupils equal round reactive to light, neck supple, no masses, no lymphadenopathy, thyroid nonpalpable.  Skin: Warm and dry, no  rashes. Cardiac: Regular rate and rhythm, no murmurs rubs or gallops, no lower extremity edema.  Respiratory: Clear to auscultation bilaterally. Not using accessory muscles, speaking in full sentences.  Impression and Recommendations:    Anxiety and depression Doing well, refilling Lexapro, he is currently taking 20 mg. Return as needed. Advised again to follow-up with Tandy GawJade Breeback, PA-C for further treatment.  ___________________________________________ Ihor Austinhomas J. Benjamin Stainhekkekandam, M.D., ABFM., CAQSM. Primary Care and Sports Medicine [facility]  Adjunct Instructor of Family Medicine  [employer]

## 2017-07-16 NOTE — Assessment & Plan Note (Signed)
Doing well, refilling Lexapro, he is currently taking 20 mg. Return as needed. Advised again to follow-up with Tandy GawJade Breeback, PA-C for further treatment.

## 2018-07-21 ENCOUNTER — Other Ambulatory Visit: Payer: Self-pay | Admitting: Sports Medicine

## 2018-07-21 DIAGNOSIS — F419 Anxiety disorder, unspecified: Principal | ICD-10-CM

## 2018-07-21 DIAGNOSIS — F329 Major depressive disorder, single episode, unspecified: Secondary | ICD-10-CM

## 2018-07-21 DIAGNOSIS — F32A Depression, unspecified: Secondary | ICD-10-CM

## 2018-07-21 NOTE — Telephone Encounter (Signed)
To PCP

## 2018-08-27 ENCOUNTER — Ambulatory Visit: Payer: BLUE CROSS/BLUE SHIELD | Admitting: Physician Assistant

## 2018-08-27 ENCOUNTER — Encounter: Payer: Self-pay | Admitting: Physician Assistant

## 2018-08-27 VITALS — BP 125/85 | HR 77 | Ht 62.0 in | Wt 135.0 lb

## 2018-08-27 DIAGNOSIS — Z23 Encounter for immunization: Secondary | ICD-10-CM | POA: Diagnosis not present

## 2018-08-27 DIAGNOSIS — J3489 Other specified disorders of nose and nasal sinuses: Secondary | ICD-10-CM

## 2018-08-27 DIAGNOSIS — Z7253 High risk bisexual behavior: Secondary | ICD-10-CM | POA: Diagnosis not present

## 2018-08-27 DIAGNOSIS — J029 Acute pharyngitis, unspecified: Secondary | ICD-10-CM

## 2018-08-27 MED ORDER — DOXYCYCLINE HYCLATE 100 MG PO TABS: 100.0000 mg | ORAL_TABLET | Freq: Two times a day (BID) | ORAL | 0 refills | Status: DC

## 2018-08-27 NOTE — Progress Notes (Signed)
Subjective:    Patient ID: Abigail [name], adult    DOB: [birth year], 23 y.o.   MRN: [id number]  HPI  Patient is a transgender female to female bisexual who presents to the clinic with increased sinus drainage and sore throat.  He feels like he has kept this mucus production for the last 6 months.  He denies any fever, ear pain, sinus pressure, cough, shortness of breath, wheezing.  He has done nothing to make this better.  He does feel like his sore throat is worsening.  He is concerned because one of his partners said they had had oral chlamydia in the past.   .. Active Ambulatory Problems    Diagnosis Date Noted  . Anxiety and depression 03/29/2012  . 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 Seasonal allergies 10/17/2013  . Asthma 10/17/2013  . 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 Viral URI 06/29/2017   Resolved Ambulatory Problems    Diagnosis Date Noted  . CERUMEN IMPACTION, LEFT 02/21/2010  . HIP PAIN, LEFT 09/06/2010  . Thrombosed external hemorrhoid 09/14/2014  . Folliculitis 01/27/2015  . Acute maxillary sinusitis 01/17/2017   No Additional Past Medical History         Review of Systems See HPI>     Objective:   Physical Exam Constitutional:      Appearance: Normal appearance.  HENT:     Head: Normocephalic and atraumatic.     Right Ear: Tympanic membrane normal.     Left Ear: Tympanic membrane normal.     Mouth/Throat:     Pharynx: Posterior oropharyngeal erythema present. No oropharyngeal exudate.     Comments: PNd with clear exudate.  1-56mm ulcer like lesion of the left upper oropharynx.  Eyes:     Conjunctiva/sclera: Conjunctivae normal.  Cardiovascular:     Rate and Rhythm: Normal rate and regular rhythm.     Pulses: Normal pulses.  Pulmonary:     Effort: Pulmonary effort is normal.     Breath sounds: Normal breath sounds.  Lymphadenopathy:     Cervical: No cervical adenopathy.  Neurological:     General: No focal deficit present.     Mental  Status: He is alert.  Psychiatric:        Mood and Affect: Mood normal.           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 -     SURESWAB CT/NG/T. vaginalis -     SureSwab HSV, Type 1/2 DNA, PCR  Need for immunization against influenza -     Flu Vaccine QUAD 36+ mos IM  Sore throat -     doxycycline (VIBRA-TABS) 100 MG tablet; Take 1 tablet (100 mg total) by mouth 2 (two) times daily. -     SURESWAB CT/NG/T. vaginalis -     SureSwab HSV, Type 1/2 DNA, PCR  Sinus drainage -     doxycycline (VIBRA-TABS) 100 MG tablet; Take 1 tablet (100 mg total) by mouth 2 (two) times daily. -     SURESWAB CT/NG/T. vaginalis -     SureSwab HSV, Type 1/2 DNA, PCR   Oral swab was done in office today to look for STDs.  I do not necessarily see anything that looks significant for all STDs.  There was a tiny ulceration that could represent herpes.  I did  swab this area well.  Since his symptoms have been going on for a few months went ahead and treated with an antibiotic to treat sinusitis.  Continue to use symptomatic care with Tylenol.  Patient declined HIV and syphilis blood work.  Follow-up as needed or symptoms persist or worsen.

## 2018-08-30 LAB — SURESWAB HSV, TYPE 1/2 DNA, PCR
HSV 1 DNA: NOT DETECTED
HSV 2 DNA: NOT DETECTED

## 2018-08-30 LAB — SURESWAB CT/NG/T. VAGINALIS
C. TRACHOMATIS RNA, TMA: NOT DETECTED
N. gonorrhoeae RNA, TMA: NOT DETECTED
TRICHOMONAS VAGINALIS RNA: NOT DETECTED

## 2018-08-30 NOTE — Progress Notes (Signed)
Call pt: no herpes! All STI negative.

## 2018-08-30 NOTE — Progress Notes (Signed)
GREAT news negative for chlamydia, gonorrhoa, trichomonas. Herpes is pending. It always takes a little longer.

## 2018-10-14 ENCOUNTER — Telehealth: Payer: Self-pay | Admitting: Sports Medicine

## 2018-10-14 DIAGNOSIS — F419 Anxiety disorder, unspecified: Principal | ICD-10-CM

## 2018-10-14 DIAGNOSIS — F329 Major depressive disorder, single episode, unspecified: Secondary | ICD-10-CM

## 2018-10-14 NOTE — Telephone Encounter (Signed)
To PCP

## 2018-10-14 NOTE — Telephone Encounter (Signed)
Left voicemail for patient to call us back to schedule a virtual visit.

## 2018-10-14 NOTE — Telephone Encounter (Signed)
I have not seen him in a while for this. I will give refill but does not a virtual visit to fill out PHQ-9/GAD-7 etc.   Ok for refill.

## 2018-12-30 IMAGING — DX DG CHEST 2V
2 series · 2 of 2 positions shown · non-contrast
Comparison: None

CLINICAL DATA: Coughing and wheezing for 1 week post antibiotics,
acute non recurrent maxillary sinusitis, history asthma

EXAM:
CHEST  2 VIEW

[chest pa]
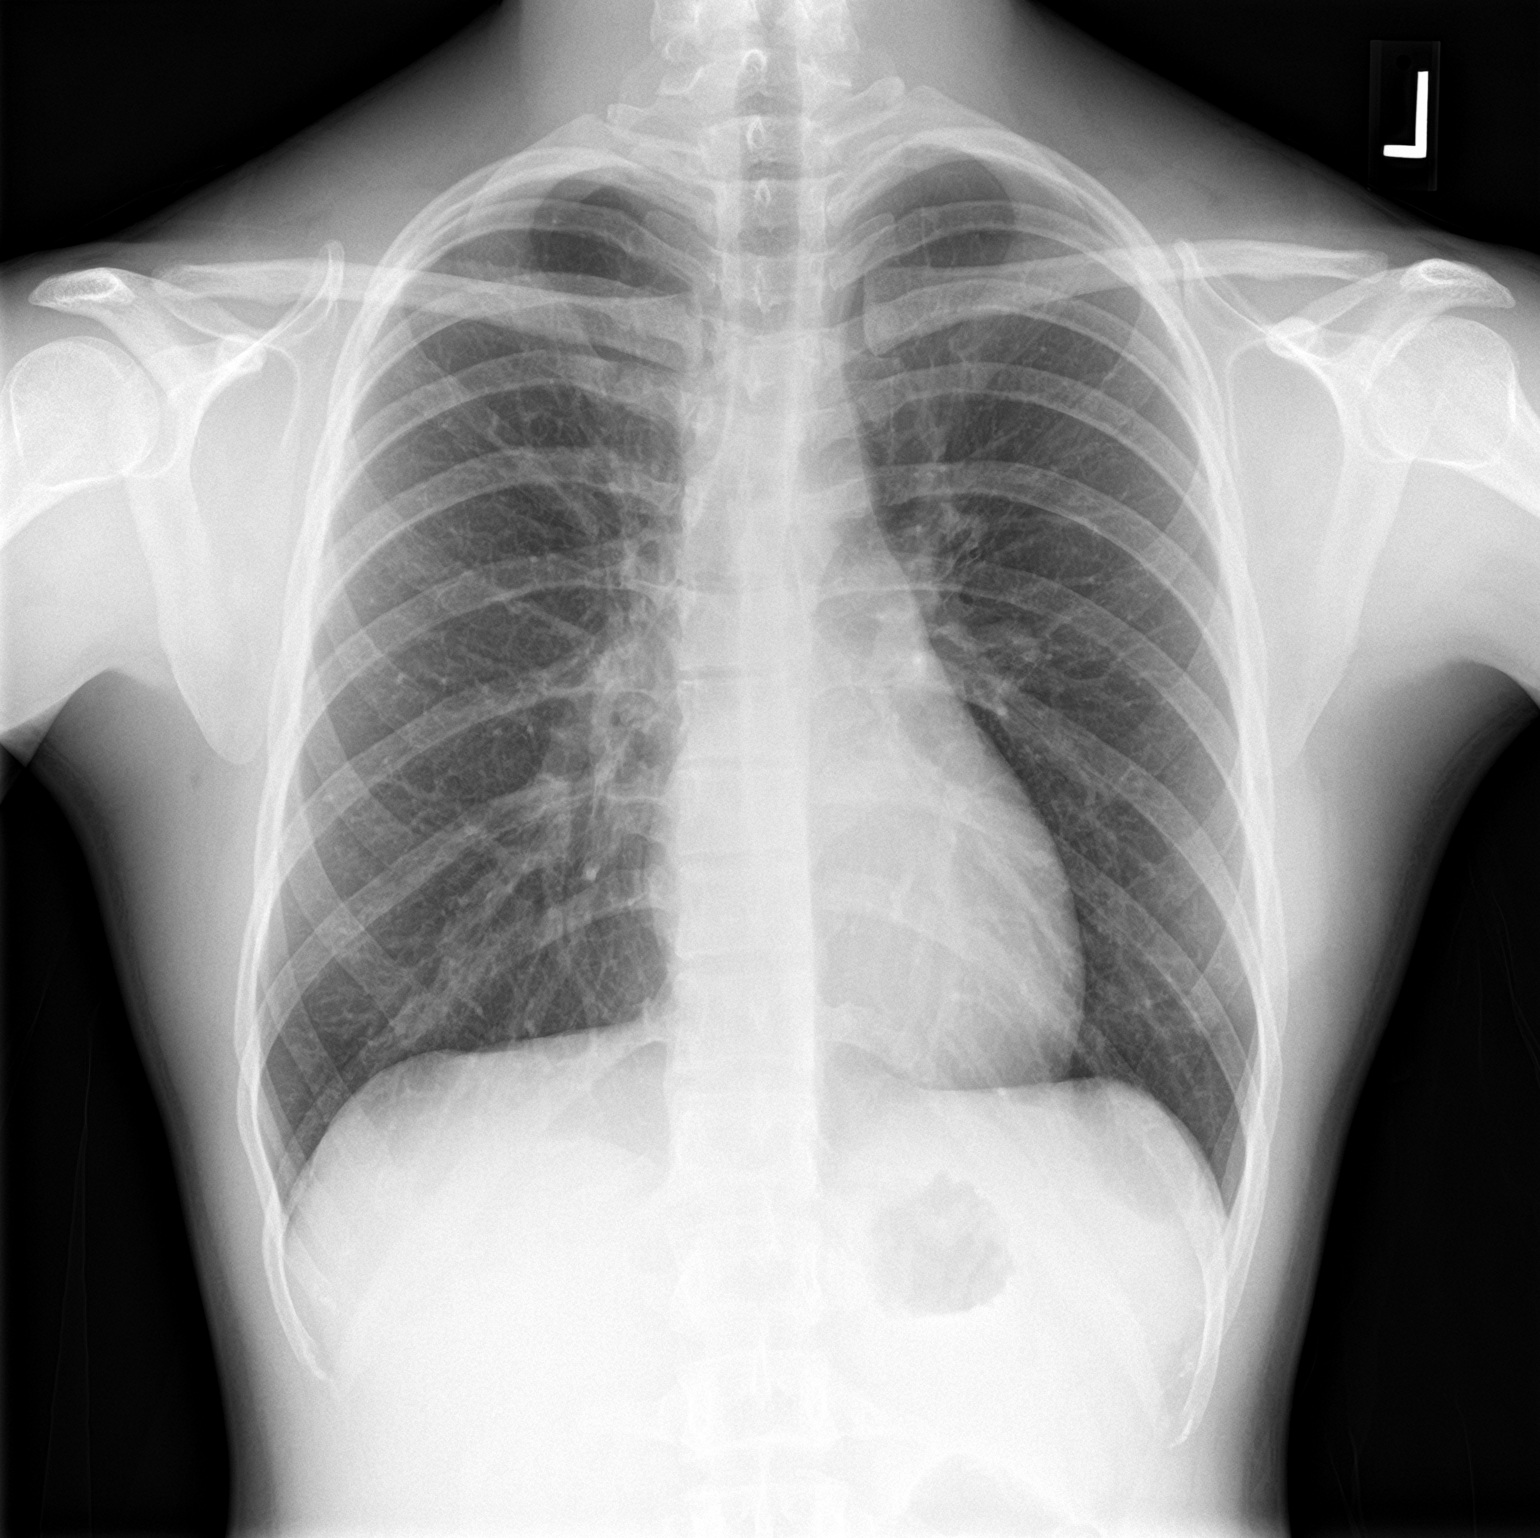

[chest lat]
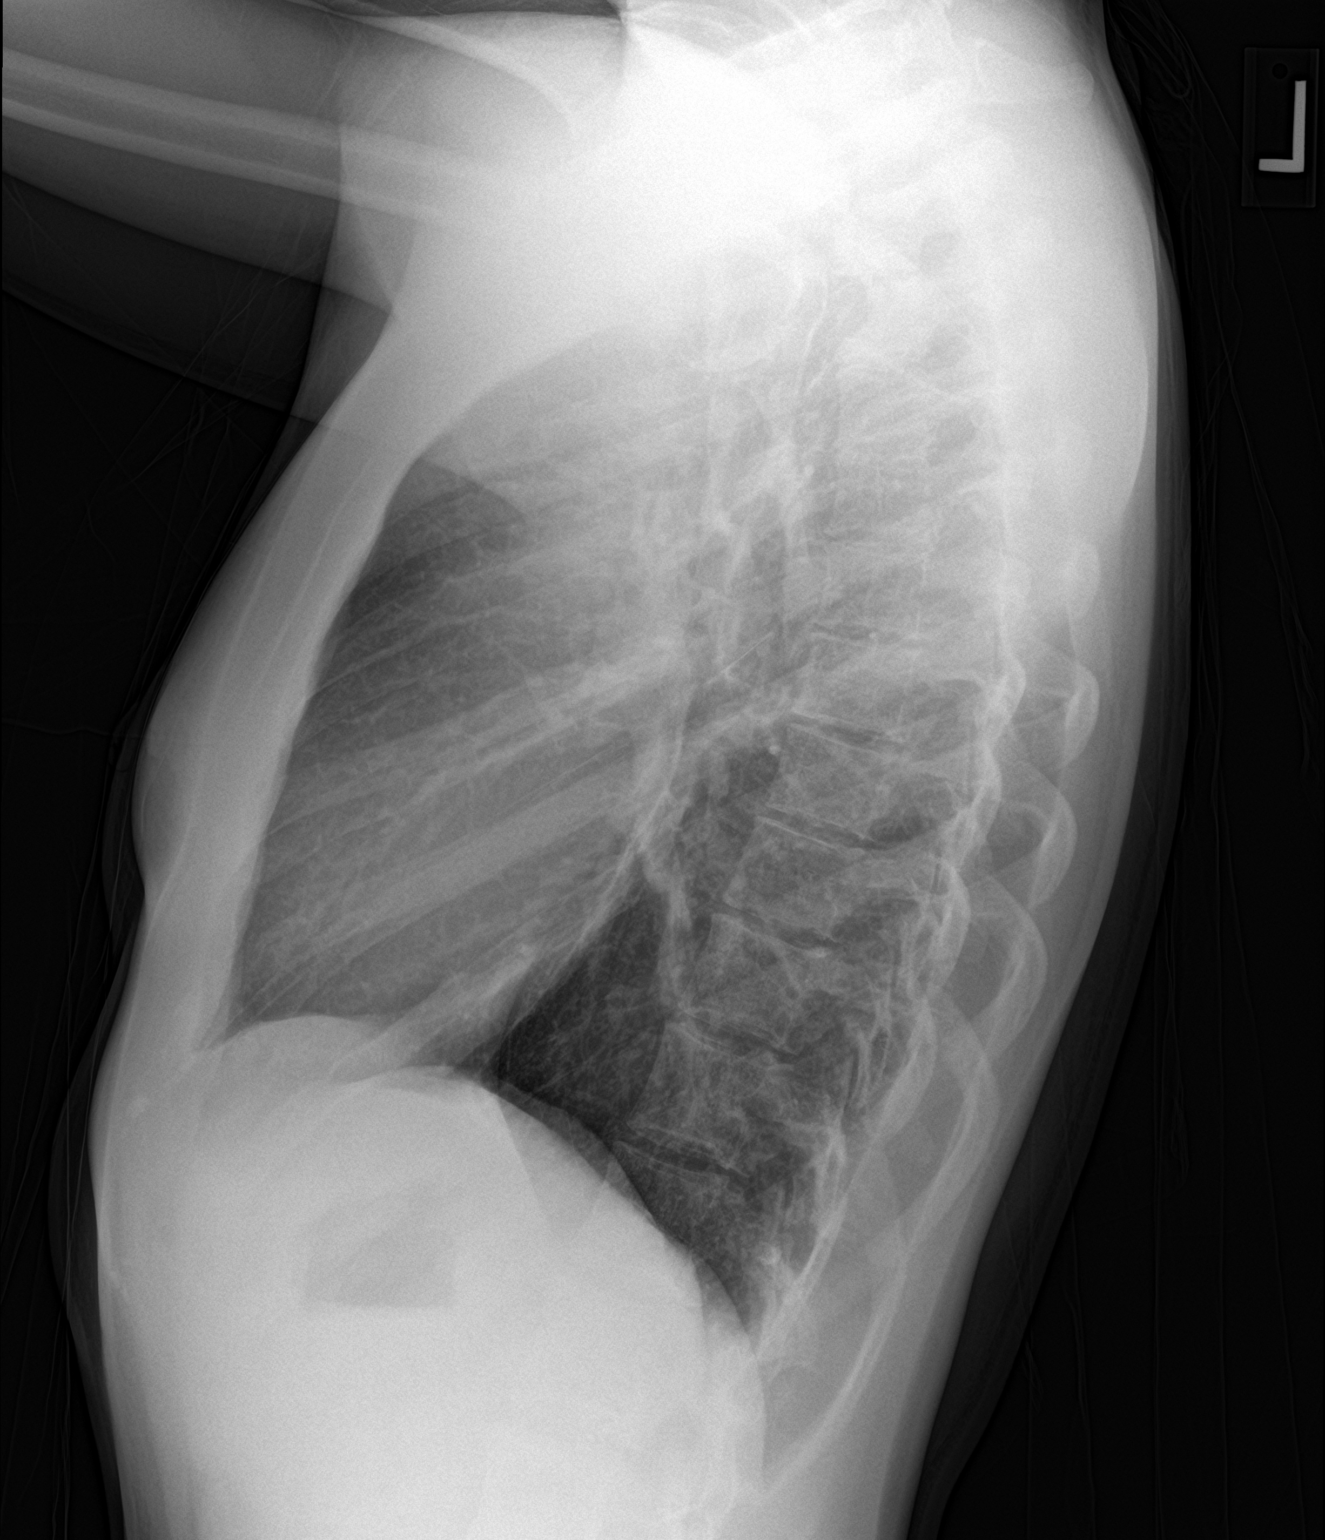

[2 of 2 positions shown; findings below may reference images not displayed]

FINDINGS: Normal heart size, mediastinal contours, and pulmonary vascularity.

Minimal peribronchial thickening.

No pulmonary infiltrate, pleural effusion, or pneumothorax.

Bones unremarkable.
IMPRESSION: Minimal bronchitic changes without infiltrate.

## 2019-01-05 ENCOUNTER — Other Ambulatory Visit: Payer: Self-pay | Admitting: Physician Assistant

## 2019-01-05 DIAGNOSIS — F419 Anxiety disorder, unspecified: Secondary | ICD-10-CM

## 2019-01-05 DIAGNOSIS — F329 Major depressive disorder, single episode, unspecified: Secondary | ICD-10-CM

## 2019-02-07 ENCOUNTER — Other Ambulatory Visit: Payer: Self-pay | Admitting: Physician Assistant

## 2019-02-07 DIAGNOSIS — F419 Anxiety disorder, unspecified: Secondary | ICD-10-CM

## 2019-02-07 DIAGNOSIS — F329 Major depressive disorder, single episode, unspecified: Secondary | ICD-10-CM

## 2019-04-02 ENCOUNTER — Encounter: Payer: Self-pay | Admitting: Physician Assistant

## 2019-04-02 ENCOUNTER — Other Ambulatory Visit: Payer: Self-pay

## 2019-04-02 ENCOUNTER — Ambulatory Visit (INDEPENDENT_AMBULATORY_CARE_PROVIDER_SITE_OTHER): Payer: BC Managed Care – PPO | Admitting: Physician Assistant

## 2019-04-02 VITALS — BP 121/66 | HR 67 | Temp 97.9°F | Ht 62.0 in | Wt 135.0 lb

## 2019-04-02 DIAGNOSIS — L539 Erythematous condition, unspecified: Secondary | ICD-10-CM

## 2019-04-02 NOTE — Progress Notes (Addendum)
  Subjective:     Patient ID: Abigail [name], adult   DOB: [birth year], 23 y.o.   MRN: [id number]  HPI Pt is a 23 y/o female presenting to clinic for a spot on his tonsil. States that he was drinking last night and noticed some pain in his throat. He stopped drinking and examined his mouth, he saw a red tonsil on the right side. This morning, he pressed his tongue down and noticed a "spot" on his right tonsil. Pt has had tonsil stones before and did not think that this was the same. Described it as if it were under the mucosa and starting to pop through. When showed pictures of tonsoliths, he said that it did not look like that. Denies soreness, fever, pain.   .. Active Ambulatory Problems    Diagnosis Date Noted  . Anxiety and depression 03/29/2012  . 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 Seasonal allergies 10/17/2013  . Asthma 10/17/2013  . 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 Viral URI 06/29/2017   Resolved Ambulatory Problems    Diagnosis Date Noted  . CERUMEN IMPACTION, LEFT 02/21/2010  . HIP PAIN, LEFT 09/06/2010  . Thrombosed external hemorrhoid 09/14/2014  . Folliculitis 12/87/8676  . Acute maxillary sinusitis 01/17/2017   No Additional Past Medical History     Review of Systems  see HPI.     Objective:   Physical Exam Constitutional:      Appearance: Normal appearance.  HENT:     Head: Normocephalic and atraumatic.     Mouth/Throat:     Mouth: Mucous membranes are moist.     Pharynx: Oropharynx is clear.     Tonsils: No tonsillar exudate or tonsillar abscesses.     Comments: Right tonsil: no tonsoliths, exudate present. There was some erythema present.  Eyes:     Extraocular Movements: Extraocular movements intact.  Neurological:     Mental Status: He is alert and oriented to person, place, and time.  Psychiatric:        Mood and Affect: Mood normal.        Behavior: Behavior normal.    .. Today's Vitals   04/02/19 1514  BP: 121/66  Pulse: 67  Temp: 97.9 F  (36.6 C)  TempSrc: Oral  SpO2: 98%  Weight: 61.2 kg  Height: 5\' 2"  (1.575 m)   Body mass index is 24.69 kg/m.     Assessment and Plan:     Marland KitchenMarland KitchenKash was seen today for area on throat.  Diagnoses and all orders for this visit:  Oropharynx erythematous   There was no stone or abnormality seen; used saline in attempt to dislodge any debris that may be there. Not sure exactly what he saw but did not see anything concerning today.  Reassured pt and encouraged some salt water gargles informed him to reach out if he develops any more concerns or symptoms.    Marland KitchenVernetta Honey PA-C, have reviewed and agree with the above documentation in it's entirety.

## 2019-04-03 DIAGNOSIS — L539 Erythematous condition, unspecified: Secondary | ICD-10-CM | POA: Insufficient documentation

## 2019-04-23 ENCOUNTER — Encounter: Payer: Self-pay | Admitting: Obstetrics and Gynecology

## 2019-04-23 ENCOUNTER — Ambulatory Visit (INDEPENDENT_AMBULATORY_CARE_PROVIDER_SITE_OTHER): Payer: BC Managed Care – PPO | Admitting: Obstetrics and Gynecology

## 2019-04-23 ENCOUNTER — Other Ambulatory Visit: Payer: Self-pay

## 2019-04-23 VITALS — BP 118/69 | HR 74 | Ht 62.0 in | Wt 134.0 lb

## 2019-04-23 DIAGNOSIS — Z113 Encounter for screening for infections with a predominantly sexual mode of transmission: Secondary | ICD-10-CM

## 2019-04-23 DIAGNOSIS — Z01419 Encounter for gynecological examination (general) (routine) without abnormal findings: Secondary | ICD-10-CM

## 2019-04-23 DIAGNOSIS — Z789 Other specified health status: Secondary | ICD-10-CM

## 2019-04-23 DIAGNOSIS — Z0000 Encounter for general adult medical examination without abnormal findings: Secondary | ICD-10-CM

## 2019-04-23 DIAGNOSIS — F640 Transsexualism: Secondary | ICD-10-CM

## 2019-04-23 NOTE — Progress Notes (Signed)
GYNECOLOGY ANNUAL PREVENTATIVE CARE ENCOUNTER NOTE  History:     Abigail [name] is a 23 y.o. G0P0000 adult here for a routine annual gynecologic exam.  Current complaints: starting menstrual cycle.   Denies abnormal discharge, or pelvic pain. Transgender has been on high dose testosterone and recently decreased dose. He thinks this is the reason his menstrual cycle came on. He is planning to have a total hysterectomy, however has not been able to find a provider local to do this.    Gynecologic History Patient's last menstrual period was 04/20/2019. Contraception: none Last Pap: 2018. Results were: normal, + chlamydia and was treated.  Last mammogram: NA.   Obstetric History OB History  Gravida Para Term Preterm AB Living  0 0 0 0 0 0  SAB TAB Ectopic Multiple Live Births  0 0 0 0 0    Past Medical History:  Diagnosis Date  . Asthma    childhood    Past Surgical History:  Procedure Laterality Date  . WISDOM TOOTH EXTRACTION      Current Outpatient Medications on File Prior to Visit  Medication Sig Dispense Refill  . escitalopram (LEXAPRO) 20 MG tablet TAKE 1 TABLET (20 MG TOTAL) BY MOUTH DAILY. **NEEDS APPT** 90 tablet 1  . testosterone cypionate (DEPOTESTOSTERONE CYPIONATE) 200 MG/ML injection Inject 200 mg into the muscle every 7 (seven) days.     Marland Kitchen albuterol (PROVENTIL HFA;VENTOLIN HFA) 108 (90 Base) MCG/ACT inhaler Inhale 2 puffs into the lungs every 6 (six) hours as needed for wheezing or shortness of breath. (Patient not taking: Reported on 04/23/2019) 1 Inhaler 2  . lidocaine-hydrocortisone (ANAMANTEL HC) 3-0.5 % CREA Place 1 Applicatorful rectally 2 (two) times daily. (Patient not taking: Reported on 04/23/2019) 85 g 5   No current facility-administered medications on file prior to visit.     Allergies  Allergen Reactions  . Amoxicillin Other (See Comments)    Social History:  reports that he has never smoked. He has never used smokeless tobacco. He reports  current alcohol use. He reports current drug use. Drug: Marijuana.  Family History  Problem Relation Age of Onset  . Hypertension Father     The following portions of the patient's history were reviewed and updated as appropriate: allergies, current medications, past family history, past medical history, past social history, past surgical history and problem list.  Review of Systems Pertinent items noted in HPI and remainder of comprehensive ROS otherwise negative.  Physical Exam:  BP 118/69   Pulse 74   Ht 5\' 2"  (1.575 m)   Wt 134 lb (60.8 kg)   LMP 04/20/2019   BMI 24.51 kg/m  CONSTITUTIONAL: Well-developed, well-nourished female in no acute distress.  HENT:  Normocephalic, atraumatic, External right and left ear normal. Oropharynx is clear and moist EYES: Conjunctivae and EOM are normal. Pupils are equal, round, and reactive to light. No scleral icterus.  NECK: Normal range of motion, supple, no masses.  Normal thyroid.  SKIN: Skin is warm and dry. No rash noted. Not diaphoretic. No erythema. No pallor. MUSCULOSKELETAL: Normal range of motion. No tenderness.  No cyanosis, clubbing, or edema.  2+ distal pulses. NEUROLOGIC: Alert and oriented to person, place, and time. Normal reflexes, muscle tone coordination. No cranial nerve deficit noted. PSYCHIATRIC: Normal mood and affect. Normal behavior. Normal judgment and thought content. CARDIOVASCULAR: Normal heart rate noted, regular rhythm RESPIRATORY: Clear to auscultation bilaterally. Effort and breath sounds normal, no problems with respiration noted. BREASTS: Symmetric in size.  No masses, skin changes, nipple drainage, or lymphadenopathy. ABDOMEN: Soft, normal bowel sounds, no distention noted.  No tenderness, rebound or guarding.  PELVIC: Normal appearing external genitalia; Wet prep collected blindly. Enlarged clitoris. Bimanual exam: no CVA, no adnexal tenderness.   Assessment and Plan:   1. Annual physical exam  - HIV  antibody (with reflex) - RPR - pap not due today - he plans to contact PCP about increasing testosterone levels. If menstrual cycle continues he can come to the office for a depo shot    2. Routine screening for STI (sexually transmitted infection)  - Cervicovaginal ancillary only( [place])  3. Transgender  Will discuss with group to find a provider within our practice to discuss hysterectomy.    Routine preventative health maintenance measures emphasized. Please refer to After Visit Summary for other counseling recommendations.    Dereck Agerton, Artist Pais, [place] for [employer], [place]

## 2019-04-23 NOTE — Progress Notes (Signed)
PT has been on Testosterone for 5 years and 7 months- hasn't had period in 5 years and then period started on Sunday. Pt states provider has lowered Testosterone dose in the past few months.   Last pap- 11/20/16- negative (but tested positive chlamydia)   Pt wants to discuss hysterectomy

## 2019-04-24 LAB — HIV ANTIBODY (ROUTINE TESTING W REFLEX): HIV 1&2 Ab, 4th Generation: NONREACTIVE

## 2019-04-24 LAB — RPR: RPR Ser Ql: NONREACTIVE

## 2019-04-25 DIAGNOSIS — Z0000 Encounter for general adult medical examination without abnormal findings: Secondary | ICD-10-CM | POA: Insufficient documentation

## 2019-04-28 LAB — CERVICOVAGINAL ANCILLARY ONLY
Chlamydia: NEGATIVE
Comment: NEGATIVE
Comment: NORMAL
Neisseria Gonorrhea: NEGATIVE

## 2019-08-06 ENCOUNTER — Other Ambulatory Visit: Payer: Self-pay | Admitting: Physician Assistant

## 2019-08-06 DIAGNOSIS — F419 Anxiety disorder, unspecified: Secondary | ICD-10-CM

## 2019-08-06 DIAGNOSIS — F329 Major depressive disorder, single episode, unspecified: Secondary | ICD-10-CM

## 2019-08-31 ENCOUNTER — Other Ambulatory Visit: Payer: Self-pay | Admitting: Physician Assistant

## 2019-08-31 DIAGNOSIS — F32A Depression, unspecified: Secondary | ICD-10-CM

## 2019-08-31 DIAGNOSIS — F329 Major depressive disorder, single episode, unspecified: Secondary | ICD-10-CM

## 2019-09-03 ENCOUNTER — Other Ambulatory Visit: Payer: Self-pay | Admitting: Physician Assistant

## 2019-09-03 DIAGNOSIS — F419 Anxiety disorder, unspecified: Secondary | ICD-10-CM

## 2019-09-03 DIAGNOSIS — F329 Major depressive disorder, single episode, unspecified: Secondary | ICD-10-CM

## 2019-09-24 ENCOUNTER — Telehealth (INDEPENDENT_AMBULATORY_CARE_PROVIDER_SITE_OTHER): Payer: BC Managed Care – PPO | Admitting: Physician Assistant

## 2019-09-24 DIAGNOSIS — F32A Depression, unspecified: Secondary | ICD-10-CM

## 2019-09-24 DIAGNOSIS — F419 Anxiety disorder, unspecified: Secondary | ICD-10-CM

## 2019-09-24 DIAGNOSIS — F329 Major depressive disorder, single episode, unspecified: Secondary | ICD-10-CM

## 2019-09-24 MED ORDER — ESCITALOPRAM OXALATE 20 MG PO TABS: 20.0000 mg | ORAL_TABLET | Freq: Every day | ORAL | 1 refills | Status: DC

## 2019-09-24 MED ORDER — BUPROPION HCL ER (XL) 150 MG PO TB24: 150.0000 mg | ORAL_TABLET | ORAL | 0 refills | Status: DC

## 2019-09-24 NOTE — Progress Notes (Signed)
PHQ9-GAD7 completed.  Patient needs refill Lexapro.  No other issues.

## 2019-09-24 NOTE — Progress Notes (Signed)
Patient ID: Abigail [name], adult   DOB: [birth year], 24 y.o.   MRN: [id number] .Marland KitchenVirtual Visit via Video Note  I connected with Corena Herter on 09/24/2019 at  1:00 PM EDT by a video enabled telemedicine application and verified that I am speaking with the correct person using two identifiers.  Location: Patient: home Provider: clinic   I discussed the limitations of evaluation and management by telemedicine and the availability of in person appointments. The patient expressed understanding and agreed to proceed.  History of Present Illness: Pt is a transgender female to female with anxiety and depression who calls in for refills. He has been on lexparo for a while and had a great response. Over the last few months he has had more anxiety and depression. Work seems to be harder to go to. It is harder to find joy in daily life. No SI/HC. No major life events. He admits to not exercises.   .. Active Ambulatory Problems    Diagnosis Date Noted  . Anxiety and depression 03/29/2012  . 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 Seasonal allergies 10/17/2013  . Asthma 10/17/2013  . 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 Viral URI 06/29/2017  . Oropharynx erythematous 04/03/2019  . Annual physical exam 04/25/2019   Resolved Ambulatory Problems    Diagnosis Date Noted  . CERUMEN IMPACTION, LEFT 02/21/2010  . HIP PAIN, LEFT 09/06/2010  . Thrombosed external hemorrhoid 09/14/2014  . Folliculitis 01/27/2015  . Acute maxillary sinusitis 01/17/2017   No Additional Past Medical History   Reviewed med, allergy, problem list.     Observations/Objective: No acute distress.  Normal mood and appearance.   .. Today's Vitals   09/24/19 1024  Weight: 134 lb (60.8 kg)  Height: 5\' 2"  (1.575 m)   Body mass index is 24.51 kg/m.   .. Depression screen [facility] 2/9 09/24/2019 08/27/2018 07/16/2017 04/03/2017 03/06/2017  Decreased Interest 2 1 1 1 2   Down, Depressed, Hopeless 1 2 1 1 2   PHQ - 2 Score 3 3 2 2 4    Altered sleeping 3 2 0 1 2  Tired, decreased energy 1 1 1 1 1   Change in appetite 3 0 0 0 1  Feeling bad or failure about yourself  2 1 2 1 1   Trouble concentrating 1 1 1  0 1  Moving slowly or fidgety/restless 2 1 0 0 0  Suicidal thoughts 1 0 1 0 1  PHQ-9 Score 16 9 7 5 11   Difficult doing work/chores Somewhat difficult Somewhat difficult - - -   .03/08/2017 GAD 7 : Generalized Anxiety Score 09/24/2019 08/27/2018 07/16/2017 04/03/2017  Nervous, Anxious, on Edge 3 1 1 1   Control/stop worrying 3 1 1 1   Worry too much - different things 3 2 1 1   Trouble relaxing 1 0 0 1  Restless 1 1 0 0  Easily annoyed or irritable 2 2 2 1   Afraid - awful might happen 3 1 1 1   Total GAD 7 Score 16 8 6 6   Anxiety Difficulty Somewhat difficult Somewhat difficult - -     Assessment and Plan:  Diagnoses and all orders for this visit:  Anxiety and depression -     escitalopram (LEXAPRO) 20 MG tablet; Take 1 tablet (20 mg total) by mouth at bedtime. -     buPROPion (WELLBUTRIN XL) 150 MG 24 hr tablet; Take 1 tablet (150 mg total) by mouth every morning.   PHQ and GAD are elevated.  Added wellbutrin to lexapro. Discussed side effects and  possible worsening of anxiety. Follow up in 3 months or as needed.  Discussed exercise, meditation, counseling to help with mood.     Follow Up Instructions:    I discussed the assessment and treatment plan with the patient. The patient was provided an opportunity to ask questions and all were answered. The patient agreed with the plan and demonstrated an understanding of the instructions.   The patient was advised to call back or seek an in-person evaluation if the symptoms worsen or if the condition fails to improve as anticipated.  I provided 10 minutes of non-face-to-face time during this encounter.   Iran Planas, PA-C

## 2019-09-26 ENCOUNTER — Encounter: Payer: Self-pay | Admitting: Physician Assistant

## 2019-12-16 ENCOUNTER — Other Ambulatory Visit: Payer: Self-pay | Admitting: Physician Assistant

## 2019-12-16 DIAGNOSIS — F32A Depression, unspecified: Secondary | ICD-10-CM

## 2019-12-23 ENCOUNTER — Encounter: Payer: Self-pay | Admitting: Physician Assistant

## 2019-12-23 ENCOUNTER — Ambulatory Visit (INDEPENDENT_AMBULATORY_CARE_PROVIDER_SITE_OTHER): Payer: BC Managed Care – PPO | Admitting: Physician Assistant

## 2019-12-23 ENCOUNTER — Other Ambulatory Visit: Payer: Self-pay

## 2019-12-23 VITALS — BP 124/57 | HR 80 | Ht 62.0 in | Wt 138.0 lb

## 2019-12-23 DIAGNOSIS — R4586 Emotional lability: Secondary | ICD-10-CM | POA: Diagnosis not present

## 2019-12-23 DIAGNOSIS — F419 Anxiety disorder, unspecified: Secondary | ICD-10-CM | POA: Diagnosis not present

## 2019-12-23 DIAGNOSIS — J4520 Mild intermittent asthma, uncomplicated: Secondary | ICD-10-CM | POA: Diagnosis not present

## 2019-12-23 DIAGNOSIS — F329 Major depressive disorder, single episode, unspecified: Secondary | ICD-10-CM

## 2019-12-23 DIAGNOSIS — Z131 Encounter for screening for diabetes mellitus: Secondary | ICD-10-CM | POA: Diagnosis not present

## 2019-12-23 MED ORDER — LAMOTRIGINE 25 MG PO TABS: 25.0000 mg | ORAL_TABLET | Freq: Every day | ORAL | 1 refills | Status: DC

## 2019-12-23 MED ORDER — ESCITALOPRAM OXALATE 20 MG PO TABS: 20.0000 mg | ORAL_TABLET | Freq: Every day | ORAL | 1 refills | Status: DC

## 2019-12-23 MED ORDER — ALBUTEROL SULFATE HFA 108 (90 BASE) MCG/ACT IN AERS: 2.0000 | INHALATION_SPRAY | Freq: Four times a day (QID) | RESPIRATORY_TRACT | 1 refills | Status: AC | PRN

## 2019-12-23 NOTE — Progress Notes (Signed)
Subjective:    Patient ID: Abigail [name], adult    DOB: 03-18-1996, 24 y.o.   MRN: [id number]  HPI  Patient is a 25 year old transgender female to female who presents to the clinic for medication follow-up.  Patient has a history of childhood asthma where he has had to use albuterol as needed.  He notices he uses this inhaler more during the spring and summer.  He uses it on average about once a week.  He would like a refill.  Patient continues to have problems with anxiety and depression.  He did not tolerate Wellbutrin and it caused him to have a headache.  He is still taking the Lexapro daily.  He had a great initial response with Lexapro but he continues to feel like his mood is all over the place.  He has really good days and then really bad days.  He denies any thoughts or plans of hurting himself but has thought he is better off dead.  He continues to work with endocrinology on his transition.  He is somewhat worried about his thyroid.  This is never been checked and he wonders if this could be the cause of some of his anxiety.  .. Active Ambulatory Problems    Diagnosis Date Noted  . Anxiety and depression 03/29/2012  . 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 Seasonal allergies 10/17/2013  . Asthma 10/17/2013  . 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 Viral URI 06/29/2017  . Oropharynx erythematous 04/03/2019  . Annual physical exam 04/25/2019  . Mood changes 12/23/2019   Resolved Ambulatory Problems    Diagnosis Date Noted  . CERUMEN IMPACTION, LEFT 02/21/2010  . HIP PAIN, LEFT 09/06/2010  . Thrombosed external hemorrhoid 09/14/2014  . Folliculitis 01/27/2015  . Acute maxillary sinusitis 01/17/2017   No Additional Past Medical History       Review of Systems  All other systems reviewed and are negative.      Objective:   Physical Exam Vitals reviewed.  Constitutional:      Appearance: Normal appearance.  HENT:     Head: Normocephalic.  Cardiovascular:     Rate  and Rhythm: Normal rate and regular rhythm.     Pulses: Normal pulses.  Pulmonary:     Effort: Pulmonary effort is normal.     Breath sounds: Normal breath sounds. No wheezing or rhonchi.  Neurological:     General: No focal deficit present.     Mental Status: He is alert and oriented to person, place, and time.  Psychiatric:        Mood and Affect: Mood normal.        Behavior: Behavior normal.       .. Depression screen [facility] 2/9 12/23/2019 09/24/2019 08/27/2018 07/16/2017 04/03/2017  Decreased Interest 2 2 1 1 1   Down, Depressed, Hopeless 1 1 2 1 1   PHQ - 2 Score 3 3 3 2 2   Altered sleeping 3 3 2  0 1  Tired, decreased energy 2 1 1 1 1   Change in appetite 3 3 0 0 0  Feeling bad or failure about yourself  2 2 1 2 1   Trouble concentrating 2 1 1 1  0  Moving slowly or fidgety/restless 1 2 1  0 0  Suicidal thoughts 1 1 0 1 0  PHQ-9 Score 17 16 9 7 5   Difficult doing work/chores Somewhat difficult Somewhat difficult Somewhat difficult - -  Some recent data might be hidden   . GAD 7 : Generalized Anxiety Score 12/23/2019  09/24/2019 08/27/2018 07/16/2017  Nervous, Anxious, on Edge 2 3 1 1   Control/stop worrying 2 3 1 1   Worry too much - different things 2 3 2 1   Trouble relaxing 1 1 0 0  Restless 1 1 1  0  Easily annoyed or irritable 3 2 2 2   Afraid - awful might happen 2 3 1 1   Total GAD 7 Score 13 16 8 6   Anxiety Difficulty Very difficult Somewhat difficult Somewhat difficult -        Assessment & Plan:  Marland KitchenMarland KitchenKash was seen today for asthma and anxiety.  Diagnoses and all orders for this visit:  Mild intermittent asthma, unspecified whether complicated -     albuterol (VENTOLIN HFA) 108 (90 Base) MCG/ACT inhaler; Inhale 2 puffs into the lungs every 6 (six) hours as needed for wheezing or shortness of breath. -     COMPLETE METABOLIC PANEL WITH GFR  Anxiety and depression -     escitalopram (LEXAPRO) 20 MG tablet; Take 1 tablet (20 mg total) by mouth at bedtime. -     TSH -      lamoTRIgine (LAMICTAL) 25 MG tablet; Take 1 tablet (25 mg total) by mouth daily.  Screening for diabetes mellitus -     COMPLETE METABOLIC PANEL WITH GFR  Mood changes -     lamoTRIgine (LAMICTAL) 25 MG tablet; Take 1 tablet (25 mg total) by mouth daily.  PHQ and GAD numbers are very high today.  Discussed options and agrees to look into counseling.  We will also start Lamictal 25 mg daily with Lexapro.  Follow-up in 6 weeks.  Patient is concerned about his thyroid.  It is never been checked.  We will check it today.  He is concerned this could be causing some of his anxiety.  Albuterol for as needed use for intermittent asthma.  Discussed if he was using this daily or more regularly to follow-up in office so we can consider a daily medication for asthma.

## 2019-12-24 LAB — COMPLETE METABOLIC PANEL WITH GFR
AG Ratio: 2.2 (calc) (ref 1.0–2.5)
ALT: 16 U/L (ref 6–29)
AST: 17 U/L (ref 10–30)
Albumin: 4.4 g/dL (ref 3.6–5.1)
Alkaline phosphatase (APISO): 63 U/L (ref 31–125)
BUN: 15 mg/dL (ref 7–25)
CO2: 25 mmol/L (ref 20–32)
Calcium: 9.2 mg/dL (ref 8.6–10.2)
Chloride: 104 mmol/L (ref 98–110)
Creat: 0.9 mg/dL (ref 0.50–1.10)
GFR, Est African American: 104 mL/min/{1.73_m2} (ref >=60)
GFR, Est Non African American: 89 mL/min/{1.73_m2} (ref >=60)
Globulin: 2 g/dL (calc) (ref 1.9–3.7)
Glucose, Bld: 80 mg/dL (ref 65–99)
Potassium: 4.4 mmol/L (ref 3.5–5.3)
Sodium: 138 mmol/L (ref 135–146)
Total Bilirubin: 1 mg/dL (ref 0.2–1.2)
Total Protein: 6.4 g/dL (ref 6.1–8.1)

## 2019-12-24 LAB — TSH: TSH: 1.08 mIU/L

## 2019-12-24 NOTE — Progress Notes (Signed)
Call pt: thyroid looks great. You are in a perfect range. No signs of hyper or hypo thyroid.  Kidney, liver, glucose look great too!

## 2020-01-14 ENCOUNTER — Other Ambulatory Visit: Payer: Self-pay | Admitting: Physician Assistant

## 2020-01-14 DIAGNOSIS — F32A Depression, unspecified: Secondary | ICD-10-CM

## 2020-01-14 DIAGNOSIS — R4586 Emotional lability: Secondary | ICD-10-CM

## 2020-02-24 ENCOUNTER — Encounter: Payer: Self-pay | Admitting: Physician Assistant

## 2020-02-24 ENCOUNTER — Other Ambulatory Visit: Payer: Self-pay

## 2020-02-24 ENCOUNTER — Ambulatory Visit (INDEPENDENT_AMBULATORY_CARE_PROVIDER_SITE_OTHER): Payer: BC Managed Care – PPO

## 2020-02-24 ENCOUNTER — Ambulatory Visit (INDEPENDENT_AMBULATORY_CARE_PROVIDER_SITE_OTHER): Payer: BC Managed Care – PPO | Admitting: Physician Assistant

## 2020-02-24 VITALS — BP 121/53 | HR 82 | Ht 62.0 in | Wt 135.0 lb

## 2020-02-24 DIAGNOSIS — R4586 Emotional lability: Secondary | ICD-10-CM

## 2020-02-24 DIAGNOSIS — M545 Low back pain, unspecified: Secondary | ICD-10-CM

## 2020-02-24 DIAGNOSIS — G8929 Other chronic pain: Secondary | ICD-10-CM

## 2020-02-24 DIAGNOSIS — F419 Anxiety disorder, unspecified: Secondary | ICD-10-CM | POA: Diagnosis not present

## 2020-02-24 DIAGNOSIS — F329 Major depressive disorder, single episode, unspecified: Secondary | ICD-10-CM | POA: Diagnosis not present

## 2020-02-24 MED ORDER — IBUPROFEN 800 MG PO TABS: 800.0000 mg | ORAL_TABLET | Freq: Three times a day (TID) | ORAL | 0 refills | Status: DC | PRN

## 2020-02-24 NOTE — Patient Instructions (Addendum)
Abigail [name] and Abigail [name] on you tube  Low Back Sprain or Strain Rehab Ask your health care provider which exercises are safe for you. Do exercises exactly as told by your health care provider and adjust them as directed. It is normal to feel mild stretching, pulling, tightness, or discomfort as you do these exercises. Stop right away if you feel sudden pain or your pain gets worse. Do not begin these exercises until told by your health care provider. Stretching and range-of-motion exercises These exercises warm up your muscles and joints and improve the movement and flexibility of your back. These exercises also help to relieve pain, numbness, and tingling. Lumbar rotation  1. Lie on your back on a firm surface and bend your knees. 2. Straighten your arms out to your sides so each arm forms a 90-degree angle (right angle) with a side of your body. 3. Slowly move (rotate) both of your knees to one side of your body until you feel a stretch in your lower back (lumbar). Try not to let your shoulders lift off the floor. 4. Hold this position for __________ seconds. 5. Tense your abdominal muscles and slowly move your knees back to the starting position. 6. Repeat this exercise on the other side of your body. Repeat __________ times. Complete this exercise __________ times a day. Single knee to chest  1. Lie on your back on a firm surface with both legs straight. 2. Bend one of your knees. Use your hands to move your knee up toward your chest until you feel a gentle stretch in your lower back and buttock. ? Hold your leg in this position by holding on to the front of your knee. ? Keep your other leg as straight as possible. 3. Hold this position for __________ seconds. 4. Slowly return to the starting position. 5. Repeat with your other leg. Repeat __________ times. Complete this exercise __________ times a day. Prone extension on elbows  1. Lie on your abdomen on a firm surface (prone position). 2. Prop  yourself up on your elbows. 3. Use your arms to help lift your chest up until you feel a gentle stretch in your abdomen and your lower back. ? This will place some of your body weight on your elbows. If this is uncomfortable, try stacking pillows under your chest. ? Your hips should stay down, against the surface that you are lying on. Keep your hip and back muscles relaxed. 4. Hold this position for __________ seconds. 5. Slowly relax your upper body and return to the starting position. Repeat __________ times. Complete this exercise __________ times a day. Strengthening exercises These exercises build strength and endurance in your back. Endurance is the ability to use your muscles for a long time, even after they get tired. Pelvic tilt This exercise strengthens the muscles that lie deep in the abdomen. 1. Lie on your back on a firm surface. Bend your knees and keep your feet flat on the floor. 2. Tense your abdominal muscles. Tip your pelvis up toward the ceiling and flatten your lower back into the floor. ? To help with this exercise, you may place a small towel under your lower back and try to push your back into the towel. 3. Hold this position for __________ seconds. 4. Let your muscles relax completely before you repeat this exercise. Repeat __________ times. Complete this exercise __________ times a day. Alternating arm and leg raises  1. Get on your hands and knees on a firm surface. If  you are on a hard floor, you may want to use padding, such as an exercise mat, to cushion your knees. 2. Line up your arms and legs. Your hands should be directly below your shoulders, and your knees should be directly below your hips. 3. Lift your left leg behind you. At the same time, raise your right arm and straighten it in front of you. ? Do not lift your leg higher than your hip. ? Do not lift your arm higher than your shoulder. ? Keep your abdominal and back muscles tight. ? Keep your hips  facing the ground. ? Do not arch your back. ? Keep your balance carefully, and do not hold your breath. 4. Hold this position for __________ seconds. 5. Slowly return to the starting position. 6. Repeat with your right leg and your left arm. Repeat __________ times. Complete this exercise __________ times a day. Abdominal set with straight leg raise  1. Lie on your back on a firm surface. 2. Bend one of your knees and keep your other leg straight. 3. Tense your abdominal muscles and lift your straight leg up, 4-6 inches (10-15 cm) off the ground. 4. Keep your abdominal muscles tight and hold this position for __________ seconds. ? Do not hold your breath. ? Do not arch your back. Keep it flat against the ground. 5. Keep your abdominal muscles tense as you slowly lower your leg back to the starting position. 6. Repeat with your other leg. Repeat __________ times. Complete this exercise __________ times a day. Single leg lower with bent knees 1. Lie on your back on a firm surface. 2. Tense your abdominal muscles and lift your feet off the floor, one foot at a time, so your knees and hips are bent in 90-degree angles (right angles). ? Your knees should be over your hips and your lower legs should be parallel to the floor. 3. Keeping your abdominal muscles tense and your knee bent, slowly lower one of your legs so your toe touches the ground. 4. Lift your leg back up to return to the starting position. ? Do not hold your breath. ? Do not let your back arch. Keep your back flat against the ground. 5. Repeat with your other leg. Repeat __________ times. Complete this exercise __________ times a day. Posture and body mechanics Good posture and healthy body mechanics can help to relieve stress in your body's tissues and joints. Body mechanics refers to the movements and positions of your body while you do your daily activities. Posture is part of body mechanics. Good posture means:  Your spine  is in its natural S-curve position (neutral).  Your shoulders are pulled back slightly.  Your head is not tipped forward. Follow these guidelines to improve your posture and body mechanics in your everyday activities. Standing   When standing, keep your spine neutral and your feet about hip width apart. Keep a slight bend in your knees. Your ears, shoulders, and hips should line up.  When you do a task in which you stand in one place for a long time, place one foot up on a stable object that is 2-4 inches (5-10 cm) high, such as a footstool. This helps keep your spine neutral. Sitting   When sitting, keep your spine neutral and keep your feet flat on the floor. Use a footrest, if necessary, and keep your thighs parallel to the floor. Avoid rounding your shoulders, and avoid tilting your head forward.  When working at a  desk or a computer, keep your desk at a height where your hands are slightly lower than your elbows. Slide your chair under your desk so you are close enough to maintain good posture.  When working at a computer, place your monitor at a height where you are looking straight ahead and you do not have to tilt your head forward or downward to look at the screen. Resting  When lying down and resting, avoid positions that are most painful for you.  If you have pain with activities such as sitting, bending, stooping, or squatting, lie in a position in which your body does not bend very much. For example, avoid curling up on your side with your arms and knees near your chest (fetal position).  If you have pain with activities such as standing for a long time or reaching with your arms, lie with your spine in a neutral position and bend your knees slightly. Try the following positions: ? Lying on your side with a pillow between your knees. ? Lying on your back with a pillow under your knees. Lifting   When lifting objects, keep your feet at least shoulder width apart and  tighten your abdominal muscles.  Bend your knees and hips and keep your spine neutral. It is important to lift using the strength of your legs, not your back. Do not lock your knees straight out.  Always ask for help to lift heavy or awkward objects. This information is not intended to replace advice given to you by your health care provider. Make sure you discuss any questions you have with your health care provider. Document Revised: 10/18/2018 Document Reviewed: 07/18/2018 Elsevier Patient Education  2020 [employer].

## 2020-02-24 NOTE — Progress Notes (Signed)
Subjective:    Patient ID: Abigail [name], adult    DOB: 19-Mar-1996, 24 y.o.   MRN: [id number]  HPI  Pt is a 24 yo female transgendered who presents to the clinic to follow up on mood. Last visit he was started on lamictal. He did not like it. He felt like it gave him a headache. He was having problems at work but the person he was having problems with quit. He feels a lot better with his mood now. No SI/HC. He continues on lexapro.   He does mention some low back pain intermittently for years but seemed to worsen over past few months. He just started going to chiropractor and does seem to be helping. No trauma or injury. Sitting seems to make worse. No recent radiation of pain into legs but has had an episode of that when he was younger. For the most part his back feels achy and tight. He does not take any medication.  .. Active Ambulatory Problems    Diagnosis Date Noted  . Anxiety and depression 03/29/2012  . 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 Seasonal allergies 10/17/2013  . Asthma 10/17/2013  . Mood changes 12/23/2019  . Chronic bilateral low back pain without sciatica 02/24/2020   Resolved Ambulatory Problems    Diagnosis Date Noted  . CERUMEN IMPACTION, LEFT 02/21/2010  . HIP PAIN, LEFT 09/06/2010  . Thrombosed external hemorrhoid 09/14/2014  . Folliculitis 01/27/2015  . 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 Acute maxillary sinusitis 01/17/2017  . Viral URI 06/29/2017  . Oropharynx erythematous 04/03/2019  . Annual physical exam 04/25/2019   No Additional Past Medical History     Review of Systems  All other systems reviewed and are negative.      Objective:   Physical Exam Vitals reviewed.  Constitutional:      Appearance: Normal appearance.  HENT:     Head: Normocephalic.  Cardiovascular:     Rate and Rhythm: Normal rate and regular rhythm.     Pulses: Normal pulses.  Pulmonary:     Effort: Pulmonary effort is normal.     Breath sounds: Normal breath sounds.   Musculoskeletal:     Comments: NROM at waist.  Negative straight leg test.  Normal 5/5 lower ext strength No pinpoint pain to palpation but describes when he has pain more in the L5/S1 distribution.    Neurological:     General: No focal deficit present.     Mental Status: He is alert and oriented to person, place, and time.  Psychiatric:        Mood and Affect: Mood normal.     .. Depression screen [facility] 2/9 02/24/2020 12/23/2019 09/24/2019 08/27/2018 07/16/2017  Decreased Interest 1 2 2 1 1   Down, Depressed, Hopeless 1 1 1 2 1   PHQ - 2 Score 2 3 3 3 2   Altered sleeping 2 3 3 2  0  Tired, decreased energy 1 2 1 1 1   Change in appetite 2 3 3  0 0  Feeling bad or failure about yourself  0 2 2 1 2   Trouble concentrating 1 2 1 1 1   Moving slowly or fidgety/restless 1 1 2 1  0  Suicidal thoughts 0 1 1 0 1  PHQ-9 Score 9 17 16 9 7   Difficult doing work/chores Somewhat difficult Somewhat difficult Somewhat difficult Somewhat difficult -  Some recent data might be hidden   . GAD 7 : Generalized Anxiety Score 02/24/2020 12/23/2019 09/24/2019 08/27/2018  Nervous, Anxious, on Edge 1  2 3 1   Control/stop worrying 1 2 3 1   Worry too much - different things 1 2 3 2   Trouble relaxing 1 1 1  0  Restless 1 1 1 1   Easily annoyed or irritable 2 3 2 2   Afraid - awful might happen 2 2 3 1   Total GAD 7 Score 9 13 16 8   Anxiety Difficulty Somewhat difficult Very difficult Somewhat difficult Somewhat difficult          Assessment & Plan:   Clothilde was seen today for mental health problem.  Diagnoses and all orders for this visit:  Anxiety and depression  Mood changes  Chronic bilateral low back pain without sciatica -     ibuprofen (ADVIL) 800 MG tablet; Take 1 tablet (800 mg total) by mouth every 8 (eight) hours as needed. -     DG Lumbar Spine Complete   Mood stable. Continue on lexapro. Follow up as needed.   Will get xray of low back. Seems like some piriformis syndrome and some  degenerative of lumbar spine pain. Start ibuprofen as needed. Gave stretches to start. Consider icy hot patches on bad days. Continue with chiropractic care if helping. May consider sports medicine here in office if pain persist. Any red flag symptoms follow up.

## 2020-02-25 NOTE — Progress Notes (Signed)
Would you take a look at xray? Young transgender female to female with intermittent low back pain for years seems to be worsening over last few months.

## 2020-02-26 NOTE — Progress Notes (Signed)
I had sports medicine review as well. You do have some disc space lost in lower spine. I think if not improving with exercises and mobic you should make appt with Dr. Karie Schwalbe in office for further treatment planning.

## 2020-07-22 ENCOUNTER — Encounter: Payer: Self-pay | Admitting: Nurse Practitioner

## 2020-07-22 ENCOUNTER — Other Ambulatory Visit: Payer: Self-pay

## 2020-07-22 ENCOUNTER — Ambulatory Visit: Payer: BC Managed Care – PPO | Admitting: Nurse Practitioner

## 2020-07-22 VITALS — BP 138/79 | HR 91 | Temp 98.6°F | Ht 62.0 in | Wt 130.8 lb

## 2020-07-22 DIAGNOSIS — K645 Perianal venous thrombosis: Secondary | ICD-10-CM | POA: Diagnosis not present

## 2020-07-22 NOTE — Progress Notes (Signed)
Acute Office Visit  Subjective:    Patient ID: Abigail [name], adult    DOB: 1996-03-12, 25 y.o.   MRN: [id number]  Chief Complaint  Patient presents with  . Hemorrhoids    HPI Lachandra is a 25 year old transgender female presenting today for evaluation of thrombosed hemorrhoid. He reports that he has had issues with hemorrhoids in the past, but he can usually manage them with ice packs, sitz baths, lidocaine, witch hazel, and or hemorrhoid creams. He has been using all of these measures and the hemorrhoid is 10/10 painful. 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   No rectal bleeding, fever, or chills.   Past Medical History:  Diagnosis Date  . Asthma    childhood    Past Surgical History:  Procedure Laterality Date  . WISDOM TOOTH EXTRACTION      Family History  Problem Relation Age of Onset  . Hypertension Father     Social History   Socioeconomic History  . Marital status: Single    Spouse name: Not on file  . Number of children: Not on file  . Years of education: Not on file  . Highest education level: Not on file  Occupational History  . Not on file  Tobacco Use  . Smoking status: Never Smoker  . Smokeless tobacco: Never Used  Substance and Sexual Activity  . Alcohol use: Yes    Comment: Occ  . Drug use: Yes    Types: Marijuana  . Sexual activity: Not Currently  Other Topics Concern  . Not on file  Social History Narrative  . Not on file   Social Determinants of Health   Financial Resource Strain: Not on file  Food Insecurity: Not on file  Transportation Needs: Not on file  Physical Activity: Not on file  Stress: Not on file  Social Connections: Not on file  Intimate Partner Violence: Not on file    Outpatient Medications Prior to Visit  Medication Sig Dispense Refill  . albuterol (VENTOLIN HFA) 108 (90 Base) MCG/ACT inhaler Inhale 2 puffs into the lungs every 6 (six)  hours as needed for wheezing or shortness of breath. 18 g 1  . escitalopram (LEXAPRO) 20 MG tablet Take 1 tablet (20 mg total) by mouth at bedtime. 90 tablet 1  . testosterone cypionate (DEPOTESTOSTERONE CYPIONATE) 200 MG/ML injection Inject 200 mg into the muscle every 7 (seven) days.     Marland Kitchen ibuprofen (ADVIL) 800 MG tablet Take 1 tablet (800 mg total) by mouth every 8 (eight) hours as needed. 90 tablet 0   No facility-administered medications prior to visit.    Allergies  Allergen Reactions  . Amoxicillin Other (See Comments)  . Lamictal [Lamotrigine]     headaches  . Wellbutrin [Bupropion]     headache    Review of Systems  All other systems reviewed and are negative.  All review of systems negative except what is listed in the HPI     Objective:    Physical Exam Vitals and nursing note reviewed.  Constitutional:      Appearance: Normal appearance.  HENT:     Head: Normocephalic.  Eyes:     Extraocular Movements: Extraocular movements intact.     Conjunctiva/sclera: Conjunctivae normal.     Pupils: Pupils are equal, round, and reactive to light.  Cardiovascular:     Rate and Rhythm: Normal rate  and regular rhythm.     Pulses: Normal pulses.  Pulmonary:     Effort: Pulmonary effort is normal.  Abdominal:     General: Abdomen is flat.     Palpations: Abdomen is soft.  Genitourinary:    Exam position: Knee-chest position.    Musculoskeletal:        General: Normal range of motion.     Cervical back: Normal range of motion.  Skin:    General: Skin is warm and dry.     Capillary Refill: Capillary refill takes less than 2 seconds.  Neurological:     General: No focal deficit present.     Mental Status: He is alert and oriented to person, place, and time.  Psychiatric:        Mood and Affect: Mood normal.        Behavior: Behavior normal.        Thought Content: Thought content normal.        Judgment: Judgment normal.     BP 138/79   Pulse 91   Temp 98.6  F (37 C)   Ht 5\' 2"  (1.575 m)   Wt 130 lb 12.8 oz (59.3 kg)   SpO2 98%   BMI 23.92 kg/m  Wt Readings from Last 3 Encounters:  07/22/20 130 lb 12.8 oz (59.3 kg)  02/24/20 135 lb (61.2 kg)  12/23/19 138 lb (62.6 kg)    There are no preventive care reminders to display for this patient.  There are no preventive care reminders to display for this patient.   Lab Results  Component Value Date   TSH 1.08 12/23/2019   Lab Results  Component Value Date   WBC 7.7 09/07/2010   HGB 13.9 09/07/2010   HCT 41.2 09/07/2010   MCV 89.6 09/07/2010   PLT 248 09/07/2010   Lab Results  Component Value Date   NA 138 12/23/2019   K 4.4 12/23/2019   CO2 25 12/23/2019   GLUCOSE 80 12/23/2019   BUN 15 12/23/2019   CREATININE 0.90 12/23/2019   BILITOT 1.0 12/23/2019   ALKPHOS 84 06/30/2015   AST 17 12/23/2019   ALT 16 12/23/2019   PROT 6.4 12/23/2019   ALBUMIN 4.2 06/30/2015   CALCIUM 9.2 12/23/2019   No results found for: CHOL No results found for: HDL No results found for: LDLCALC No results found for: TRIG No results found for: CHOLHDL No results found for: 12/25/2019     Assessment & Plan:   1. Thrombosed external hemorrhoid 56mm thrombosed hemorrhoid.  Excision and drainage performed. Patient provided with education and recommendations for sitz baths, gauze, over the counter medication use, and stool softeners.  Follow-up if bleeding becomes heavy or does not stop in 24 hours or if any questions arise.   PROCEDURE Cleansed area with chlorhexidine swab and draped in sterile fashion. 25mL of 1% lidocaine with epinephrine infiltrated into surrounding tissue creating a nerve block.  Using sterile technique 49mm eliptical incision made into hemorrhoid and clotted blood removed with manual pressure.  Area cleansed and gauze dressing applied.  Minimal bleeding present.   Return if symptoms worsen or fail to improve.   0m, NP

## 2020-07-22 NOTE — Patient Instructions (Signed)
Surgical Procedures for Hemorrhoids Surgical procedures can be used to treat hemorrhoids. Hemorrhoids are swollen veins that are inside the rectum (internal hemorrhoids) or around the anus (external hemorrhoids). They are caused by increased pressure in the anal area. This pressure may result from straining to have a bowel movement (constipation), diarrhea, pregnancy, obesity, or sitting for long periods of time. Hemorrhoids can cause symptoms such as pain and bleeding. Surgery may be needed if diet changes, lifestyle changes, and other treatments do not help your symptoms. Common surgical methods that may be used include:  Closed hemorrhoidectomy. The hemorrhoids are surgically removed, and the incisions are closed with stitches (sutures).  Open hemorrhoidectomy. The hemorrhoids are surgically removed, but the incisions are allowed to heal without sutures.  Stapled hemorrhoidectomy. The hemorrhoids are partially removed, and the incisions are closed with staples. Tell a health care provider about:  Any allergies you have.  All medicines you are taking, including vitamins, herbs, eye drops, creams, and over-the-counter medicines.  Any problems you or family members have had with anesthetic medicines.  Any blood disorders you have.  Any surgeries you have had.  Any medical conditions you have.  Whether you are pregnant or may be pregnant. What are the risks? Generally, this is a safe procedure. However, problems may occur, including:  Infection.  Bleeding.  Allergic reactions to medicines.  Damage to other structures or organs.  Pain.  Constipation.  Difficulty passing urine.  Narrowing of the anal canal (stenosis).  Difficulty controlling bowel movements (incontinence).  Recurring hemorrhoids.  A new passage (fistula) that forms between the anus or rectum and another area. What happens before the procedure? Medicines  Ask your health care provider about: ? Changing  or stopping your regular medicines. This is especially important if you are taking diabetes medicines or blood thinners. ? Taking medicines such as aspirin and ibuprofen. These medicines can thin your blood. Do not take these medicines unless your health care provider tells you to take them. ? Taking over-the-counter medicines, vitamins, herbs, and supplements. Staying hydrated Follow instructions from your health care provider about hydration, which may include:  Up to 2 hours before the procedure - you may continue to drink clear liquids, such as water, clear fruit juice, black coffee, and plain tea.   Eating and drinking Follow instructions from your health care provider about eating and drinking, which may include:  8 hours before the procedure - stop eating heavy meals or foods, such as meat, fried foods, or fatty foods.  6 hours before the procedure - stop eating light meals or foods, such as toast or cereal.  6 hours before the procedure - stop drinking milk or drinks that contain milk.  2 hours before the procedure - stop drinking clear liquids. General instructions  You may need to have a procedure to examine the inside of your colon with a scope (colonoscopy). Your health care provider may do this to make sure that there are no other causes for your bleeding or pain.  You may be instructed to take a laxative and an enema to clean out your colon before surgery (bowel prep). Carefully follow instructions from your health care provider about bowel prep.  Plan to have someone take you home from the hospital or clinic.  Plan to have a responsible adult care for you for at least 24 hours after you leave the hospital or clinic. This is important.  Ask your health care provider: ? How your surgery site will be marked. ?   What steps will be taken to help prevent infection. These may include:  Washing skin with a germ-killing soap.  Taking antibiotic medicine. What happens during the  procedure?  An IV will be inserted into one of your veins.  You will be given one or more of the following: ? A medicine to help you relax (sedative). ? A medicine to numb the area (local anesthetic). ? A medicine to make you fall asleep (general anesthetic). ? A medicine that is injected into an area of your body to numb everything below the injection site (regional anesthetic).  A lubricating jelly may be placed into your rectum.  Your surgeon will insert a short scope (anoscope) into your rectum to examine the hemorrhoids.  One of the following surgical methods will be used to remove the hemorrhoids: ? Closed hemorrhoidectomy.  Your surgeon will use surgical instruments to open the tissue around the hemorrhoids.  The veins that supply the hemorrhoids will be tied off with a suture.  The hemorrhoids will be removed.  The tissue that surrounds the hemorrhoids will be closed with sutures that your body can absorb (absorbable sutures). ? Open hemorrhoidectomy.  The hemorrhoids will be removed with surgical instruments.  The incisions will be left open to heal without sutures. ? Stapled hemorrhoidectomy.  Your surgeon will use a circular stapling device to partially remove the hemorrhoids.  The device will be inserted into your anus. It will remove a circular ring of tissue that includes hemorrhoid tissue and some tissue above the hemorrhoids.  The staples in the device will close the edges of the tissue. This will cut off the blood supply to any remaining hemorrhoids and pull the tissue back into place. Each of these procedures may vary among health care providers and hospitals. What happens after the procedure?  Your blood pressure, heart rate, breathing rate, and blood oxygen level may be monitored until you leave the hospital or clinic.  You will be given pain medicine as needed.  Do not drive for 24 hours if you were given a sedative during your  procedure. Summary  Surgery may be needed for hemorrhoids if diet changes, lifestyle changes, and other treatments do not help your symptoms.  There are three common methods of surgery that are used to treat hemorrhoids.  Follow instructions from your health care provider about taking medicines and about eating and drinking before the procedure.  You may be instructed to take a laxative and an enema to clean out your colon before surgery (bowel prep). This information is not intended to replace advice given to you by your health care provider. Make sure you discuss any questions you have with your health care provider. Document Revised: 12/11/2018 Document Reviewed: 05/14/2018 Elsevier Patient Education  2021 Elsevier Inc.  

## 2020-11-01 ENCOUNTER — Other Ambulatory Visit: Payer: Self-pay | Admitting: Physician Assistant

## 2020-11-01 DIAGNOSIS — F419 Anxiety disorder, unspecified: Secondary | ICD-10-CM

## 2020-12-08 ENCOUNTER — Other Ambulatory Visit: Payer: Self-pay

## 2020-12-08 ENCOUNTER — Ambulatory Visit: Payer: BC Managed Care – PPO | Admitting: Physician Assistant

## 2020-12-08 ENCOUNTER — Encounter: Payer: Self-pay | Admitting: Physician Assistant

## 2020-12-08 VITALS — BP 121/73 | HR 74 | Ht 62.0 in | Wt 158.0 lb

## 2020-12-08 DIAGNOSIS — Z83518 Family history of other specified eye disorder: Secondary | ICD-10-CM | POA: Diagnosis not present

## 2020-12-08 DIAGNOSIS — Z13220 Encounter for screening for lipoid disorders: Secondary | ICD-10-CM

## 2020-12-08 DIAGNOSIS — F32A Depression, unspecified: Secondary | ICD-10-CM

## 2020-12-08 DIAGNOSIS — F419 Anxiety disorder, unspecified: Secondary | ICD-10-CM

## 2020-12-08 DIAGNOSIS — Z1329 Encounter for screening for other suspected endocrine disorder: Secondary | ICD-10-CM | POA: Diagnosis not present

## 2020-12-08 DIAGNOSIS — Z131 Encounter for screening for diabetes mellitus: Secondary | ICD-10-CM | POA: Diagnosis not present

## 2020-12-08 DIAGNOSIS — Z135 Encounter for screening for eye and ear disorders: Secondary | ICD-10-CM

## 2020-12-08 DIAGNOSIS — Z79899 Other long term (current) drug therapy: Secondary | ICD-10-CM

## 2020-12-08 MED ORDER — ESCITALOPRAM OXALATE 20 MG PO TABS: 20.0000 mg | ORAL_TABLET | Freq: Every day | ORAL | 3 refills | Status: DC

## 2020-12-08 NOTE — Progress Notes (Signed)
Subjective:    Patient ID: Abigail [name], adult    DOB: 02-14-1996, 25 y.o.   MRN: [id number]  HPI  Pt is a 25 yo transgender female who presents to the clinic for follow up and medication refill.   Pt is doing ok with mood.  Denies any suicidal thoughts or homicidal idealizations.  When he remembers to take Lexapro daily he does really well.  He often forgets.  He has no concerns or complaints on medication.  Patient is concerned because his father has been diagnosed with per chart uveitis and since he has more dysgenetic he is wanting him to keep a close eye on any symptoms.  Patient does wear glasses and sees an optometrist but not an ophthalmologist.  He denies any vision changes.    .. Active Ambulatory Problems    Diagnosis Date Noted  . Anxiety and depression 03/29/2012  . 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 Seasonal allergies 10/17/2013  . Asthma 10/17/2013  . Mood changes 12/23/2019  . Chronic bilateral low back pain without sciatica 02/24/2020  . Female-to-female transgender person 08/31/2013  . Family history of uveitis 12/08/2020   Resolved Ambulatory Problems    Diagnosis Date Noted  . CERUMEN IMPACTION, LEFT 02/21/2010  . HIP PAIN, LEFT 09/06/2010  . Thrombosed external hemorrhoid 09/14/2014  . Folliculitis 09/40/7680  . 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 Acute maxillary sinusitis 01/17/2017  . Viral URI 06/29/2017  . Oropharynx erythematous 04/03/2019  . Annual physical exam 04/25/2019   No Additional Past Medical History     Review of Systems  All other systems reviewed and are negative.      Objective:   Physical Exam Vitals reviewed.  Constitutional:      Appearance: Normal appearance.  HENT:     Head: Normocephalic.  Cardiovascular:     Rate and Rhythm: Normal rate and regular rhythm.  Pulmonary:     Effort: Pulmonary effort is normal.     Breath sounds: Normal breath sounds.  Neurological:     General: No focal deficit present.     Mental  Status: He is alert and oriented to person, place, and time.  Psychiatric:        Mood and Affect: Mood normal.       .. Depression screen [facility] 2/9 12/08/2020 02/24/2020 12/23/2019 09/24/2019 08/27/2018  Decreased Interest 0 '1 2 2 1  ' Down, Depressed, Hopeless '1 1 1 1 2  ' PHQ - 2 Score '1 2 3 3 3  ' Altered sleeping '2 2 3 3 2  ' Tired, decreased energy '1 1 2 1 1  ' Change in appetite '2 2 3 3 ' 0  Feeling bad or failure about yourself  2 0 '2 2 1  ' Trouble concentrating '1 1 2 1 1  ' Moving slowly or fidgety/restless '2 1 1 2 1  ' Suicidal thoughts 1 0 1 1 0  PHQ-9 Score '12 9 17 16 9  ' Difficult doing work/chores Somewhat difficult Somewhat difficult Somewhat difficult Somewhat difficult Somewhat difficult  Some recent data might be hidden   .Marland Kitchen GAD 7 : Generalized Anxiety Score 12/08/2020 02/24/2020 12/23/2019 09/24/2019  Nervous, Anxious, on Edge '2 1 2 3  ' Control/stop worrying '2 1 2 3  ' Worry too much - different things '2 1 2 3  ' Trouble relaxing '1 1 1 1  ' Restless '2 1 1 1  ' Easily annoyed or irritable '3 2 3 2  ' Afraid - awful might happen '1 2 2 3  ' Total GAD 7 Score 13 9  13 16  Anxiety Difficulty Somewhat difficult Somewhat difficult Very difficult Somewhat difficult        Assessment & Plan:  Marland KitchenMarland KitchenKash was seen today for follow-up.  Diagnoses and all orders for this visit:  Family history of uveitis -     Sed Rate (ESR) -     C-reactive protein -     HLA-B27 antigen -     ANA -     Other/Misc lab test -     Ambulatory referral to Ophthalmology  Screening for diabetes mellitus -     COMPLETE METABOLIC PANEL WITH GFR  Lipid screening -     Lipid Panel w/reflex Direct LDL  Thyroid disorder screen -     TSH  Medication management -     CBC with Differential/Platelet -     COMPLETE METABOLIC PANEL WITH GFR -     Lipid Panel w/reflex Direct LDL -     TSH  Anxiety and depression -     escitalopram (LEXAPRO) 20 MG tablet; Take 1 tablet (20 mg total) by mouth daily.  Screening for eye  condition -     Ambulatory referral to Ophthalmology   Refilled lexapro. Pt feels like it does well but he often forgets to take it daily.   Pt needs opthalmology. Referral placed. Strongly recommend to go yearly to look for any changes to eyes.  Screening labs done today. Discussed red flag symptoms of uveitis.

## 2020-12-08 NOTE — Patient Instructions (Signed)
Will make referral to opthalmology.  Get labs today.

## 2020-12-10 NOTE — Progress Notes (Signed)
Tomi,   Cholesterol looks great.  Thyroid wonderful.  Hemoglobin and hematocrit a little elevated likely due to hormone replacement.  Some labs pending. Will wait to order more testing but your one genetic marker HLB was elevated but needs PCR to confirm.

## 2020-12-11 LAB — COMPLETE METABOLIC PANEL WITH GFR
AG Ratio: 2.1 (calc) (ref 1.0–2.5)
ALT: 24 U/L (ref 6–29)
AST: 19 U/L (ref 10–30)
Albumin: 4.4 g/dL (ref 3.6–5.1)
Alkaline phosphatase (APISO): 56 U/L (ref 31–125)
BUN: 15 mg/dL (ref 7–25)
CO2: 28 mmol/L (ref 20–32)
Calcium: 9.2 mg/dL (ref 8.6–10.2)
Chloride: 105 mmol/L (ref 98–110)
Creat: 0.98 mg/dL (ref 0.50–1.10)
GFR, Est African American: 93 mL/min/{1.73_m2} (ref >=60)
GFR, Est Non African American: 80 mL/min/{1.73_m2} (ref >=60)
Globulin: 2.1 g/dL (calc) (ref 1.9–3.7)
Glucose, Bld: 82 mg/dL (ref 65–99)
Potassium: 4.6 mmol/L (ref 3.5–5.3)
Sodium: 141 mmol/L (ref 135–146)
Total Bilirubin: 0.7 mg/dL (ref 0.2–1.2)
Total Protein: 6.5 g/dL (ref 6.1–8.1)

## 2020-12-11 LAB — CBC WITH DIFFERENTIAL/PLATELET
Absolute Monocytes: 400 cells/uL (ref 200–950)
Basophils Absolute: 61 cells/uL (ref 0–200)
Basophils Relative: 1.3 %
Eosinophils Absolute: 80 cells/uL (ref 15–500)
Eosinophils Relative: 1.7 %
HCT: 46.9 % — ABNORMAL HIGH (ref 35.0–45.0)
Hemoglobin: 15.8 g/dL — ABNORMAL HIGH (ref 11.7–15.5)
Lymphs Abs: 1748 cells/uL (ref 850–3900)
MCH: 31.5 pg (ref 27.0–33.0)
MCHC: 33.7 g/dL (ref 32.0–36.0)
MCV: 93.6 fL (ref 80.0–100.0)
MPV: 10.2 fL (ref 7.5–12.5)
Monocytes Relative: 8.5 %
Neutro Abs: 2411 cells/uL (ref 1500–7800)
Neutrophils Relative %: 51.3 %
Platelets: 228 10*3/uL (ref 140–400)
RBC: 5.01 10*6/uL (ref 3.80–5.10)
RDW: 11.8 % (ref 11.0–15.0)
Total Lymphocyte: 37.2 %
WBC: 4.7 10*3/uL (ref 3.8–10.8)

## 2020-12-11 LAB — TSH: TSH: 2.1 mIU/L

## 2020-12-11 LAB — LIPID PANEL W/REFLEX DIRECT LDL
Cholesterol: 170 mg/dL (ref <200)
HDL: 52 mg/dL (ref >=50)
LDL Cholesterol (Calc): 98 mg/dL (calc)
Non-HDL Cholesterol (Calc): 118 mg/dL (calc) (ref <130)
Total CHOL/HDL Ratio: 3.3 (calc) (ref <5.0)
Triglycerides: 102 mg/dL (ref <150)

## 2020-12-11 LAB — SEDIMENTATION RATE: Sed Rate: 2 mm/h (ref 0–20)

## 2020-12-11 LAB — ANA: Anti Nuclear Antibody (ANA): NEGATIVE

## 2020-12-11 LAB — HLA-B27 ANTIGEN: HLA-B27 Antigen: POSITIVE — AB

## 2020-12-11 LAB — C-REACTIVE PROTEIN: CRP: 0.5 mg/L (ref <8.0)

## 2020-12-13 NOTE — Progress Notes (Signed)
JJ, what is going on with mic HLA testing?   ANA negative which should be positive with active autoimmune condition.  B27 elevated it was very weak positive.

## 2020-12-16 LAB — HLA A29 BIRDSHOT RETINOCHOROIDOPATHY

## 2020-12-20 NOTE — Progress Notes (Signed)
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

## 2021-02-01 ENCOUNTER — Encounter: Payer: Self-pay | Admitting: Obstetrics and Gynecology

## 2021-02-01 ENCOUNTER — Other Ambulatory Visit (HOSPITAL_COMMUNITY)
Admission: RE | Admit: 2021-02-01 | Discharge: 2021-02-01 | Disposition: A | Discharge status: Home / Self Care | Payer: BC Managed Care – PPO | Source: Ambulatory Visit | Attending: Obstetrics and Gynecology | Admitting: Obstetrics and Gynecology

## 2021-02-01 ENCOUNTER — Other Ambulatory Visit: Payer: Self-pay

## 2021-02-01 ENCOUNTER — Ambulatory Visit (INDEPENDENT_AMBULATORY_CARE_PROVIDER_SITE_OTHER): Payer: BC Managed Care – PPO | Admitting: Obstetrics and Gynecology

## 2021-02-01 VITALS — BP 134/82 | HR 71 | Resp 16 | Ht 62.0 in | Wt 161.0 lb

## 2021-02-01 DIAGNOSIS — Z0000 Encounter for general adult medical examination without abnormal findings: Secondary | ICD-10-CM | POA: Insufficient documentation

## 2021-02-01 NOTE — Progress Notes (Signed)
GYNECOLOGY ANNUAL PREVENTATIVE CARE ENCOUNTER NOTE  History:     Abigail [name] is a 25 y.o. G0P0000 transgender adult here for a routine annual gynecologic exam.  Current complaints: vaginal bleeding; thick like mucus bleeding. Her has not had a normal/ regular period in over a year. Currently taking testerone and has not missed a dose. In the past if he missed doses he would have menstrual like bleeding. No changes in sex or medication that would potentially cause the bleeding.  Denies abnormal discharge, pelvic pain, problems with intercourse or other gynecologic concerns.   Currently in a relation and happy.    Gynecologic History No LMP recorded. Patient has had an injection. Contraception: none Last Pap: 2018, Result was normal with negative HPV   Obstetric History OB History  Gravida Para Term Preterm AB Living  0 0 0 0 0 0  SAB IAB Ectopic Multiple Live Births  0 0 0 0 0    Past Medical History:  Diagnosis Date   Asthma    childhood    Past Surgical History:  Procedure Laterality Date   WISDOM TOOTH EXTRACTION      Current Outpatient Medications on File Prior to Visit  Medication Sig Dispense Refill   albuterol (VENTOLIN HFA) 108 (90 Base) MCG/ACT inhaler Inhale 2 puffs into the lungs every 6 (six) hours as needed for wheezing or shortness of breath. 18 g 1   escitalopram (LEXAPRO) 20 MG tablet Take 1 tablet (20 mg total) by mouth daily. 90 tablet 3   testosterone cypionate (DEPOTESTOSTERONE CYPIONATE) 200 MG/ML injection Inject 200 mg into the muscle every 7 (seven) days.      No current facility-administered medications on file prior to visit.    Allergies  Allergen Reactions   Amoxicillin Other (See Comments)   Lamictal [Lamotrigine]     headaches   Wellbutrin [Bupropion]     headache    Social History:  reports that he has never smoked. He has never used smokeless tobacco. He reports current alcohol use. He reports current drug use. Drug:  Marijuana.  Family History  Problem Relation Age of Onset   Hypertension Father     The following portions of the patient's history were reviewed and updated as appropriate: allergies, current medications, past family history, past medical history, past social history, past surgical history and problem list.  Review of Systems Pertinent items noted in HPI and remainder of comprehensive ROS otherwise negative.  Physical Exam:  BP 134/82   Pulse 71   Resp 16   Ht _0  (1.575 m)   Wt 161 lb (73 kg)   BMI 29.45 kg/m  CONSTITUTIONAL: Well-developed, well-nourished female in no acute distress.  HENT:  Normocephalic, atraumatic, External right and left ear normal.  EYES: Conjunctivae and EOM are normal. Pupils are equal, round, and reactive to light. No scleral icterus.  NECK: Normal range of motion, supple, no masses.  Normal thyroid.  SKIN: Skin is warm and dry. No rash noted. Not diaphoretic. No erythema. No pallor. MUSCULOSKELETAL: Normal range of motion. No tenderness.  No cyanosis, clubbing, or edema. NEUROLOGIC: Alert and oriented to person, place, and time. Normal reflexes, muscle tone coordination.  PSYCHIATRIC: Normal mood and affect. Normal behavior. Normal judgment and thought content. CARDIOVASCULAR: Normal heart rate noted, regular rhythm RESPIRATORY: Clear to auscultation bilaterally. Effort and breath sounds normal, no problems with respiration noted. BREASTS: Symmetric in size. No masses, tenderness, skin changes, nipple drainage, or lymphadenopathy bilaterally. Performed in the  presence of a chaperone. ABDOMEN: Soft, no distention noted.  No tenderness, rebound or guarding.  PELVIC: enlarged clitoris,  normal appearing vaginal mucosa and cervix.  No abnormal vaginal discharge noted.  Small amount of dark red blood noted in vault. Pap smear obtained.  Normal uterine size, no other palpable masses, no uterine or adnexal tenderness.  Performed in the presence of a chaperone.    Assessment and Plan:   1. Annual physical exam  - Cytology - PAP( [place])  - Patient desires hysterectomy. He met with [facility] and did not feel comfortable proceeding with procedure with this practice. Would like to meet with someone at [facility] to discuss options.   Will follow up results of pap smear and manage accordingly.   Mabry Tift, Artist Pais, [place] for [employer], [place]

## 2021-02-03 LAB — CYTOLOGY - PAP
Chlamydia: NEGATIVE
Comment: NEGATIVE
Comment: NORMAL
Diagnosis: NEGATIVE
Neisseria Gonorrhea: NEGATIVE

## 2021-02-17 ENCOUNTER — Ambulatory Visit (INDEPENDENT_AMBULATORY_CARE_PROVIDER_SITE_OTHER): Payer: BC Managed Care – PPO | Admitting: Family Medicine

## 2021-02-17 ENCOUNTER — Other Ambulatory Visit: Payer: Self-pay

## 2021-02-17 ENCOUNTER — Encounter: Payer: Self-pay | Admitting: Family Medicine

## 2021-02-17 DIAGNOSIS — Z789 Other specified health status: Secondary | ICD-10-CM

## 2021-02-17 DIAGNOSIS — F640 Transsexualism: Secondary | ICD-10-CM | POA: Diagnosis not present

## 2021-02-17 NOTE — Progress Notes (Signed)
   Subjective:    Patient ID: Abigail [name] is a 25 y.o. adult presenting with Consult (Discuss surgery)  on 02/17/2021  HPI: Patient is a transgender female who is on testosterone x many years. He is having a cycle q 6 months. He is interested in having his uterus and ovaries removed.  Review of Systems  Constitutional:  Negative for chills and fever.  Respiratory:  Negative for shortness of breath.   Cardiovascular:  Negative for chest pain.  Gastrointestinal:  Negative for abdominal pain, nausea and vomiting.  Genitourinary:  Negative for dysuria.  Skin:  Negative for rash.     Objective:    BP 115/65   Pulse 69   Ht 5\' 2"  (1.575 m)   Wt 159 lb (72.1 kg)   BMI 29.08 kg/m  Physical Exam Constitutional:      General: He is not in acute distress.    Appearance: He is well-developed.  HENT:     Head: Normocephalic and atraumatic.  Eyes:     General: No scleral icterus. Cardiovascular:     Rate and Rhythm: Normal rate.  Pulmonary:     Effort: Pulmonary effort is normal.  Abdominal:     Palpations: Abdomen is soft.  Musculoskeletal:     Cervical back: Neck supple.  Skin:    General: Skin is warm and dry.  Neurological:     Mental Status: He is alert and oriented to person, place, and time.        Assessment & Plan:   Problem List Items Addressed This Visit       Unprioritized   Transgender    For TVH with BSO. Discussed paucity of literature for BSO in the transgender population around life expectancy. Would need to stay on testosterone. We discussed eliminating availability to have egg preservation and he is ok with this.  Risks include but are not limited to bleeding, infection, injury to surrounding structures, including bowel, bladder and ureters, blood clots, and death.  Likelihood of success is high.        Total time in review of prior notes, nature of surgery, recovery, reasoning for removal or preservation of ovaries, risks related to surgery, exam,  note writing, discussion of options, plan for next steps, and timing of surgery, return to work alternatives and risks of treatment: 35 minutes.  Return in about 3 months (around 05/20/2021) for postop check.  13/05/2021 02/17/2021 11:27 AM

## 2021-02-17 NOTE — Progress Notes (Signed)
Pt is here today to discuss possibility of hysterectomy.

## 2021-02-17 NOTE — Assessment & Plan Note (Signed)
For TVH with BSO. Discussed paucity of literature for BSO in the transgender population around life expectancy. Would need to stay on testosterone. We discussed eliminating availability to have egg preservation and he is ok with this.  Risks include but are not limited to bleeding, infection, injury to surrounding structures, including bowel, bladder and ureters, blood clots, and death.  Likelihood of success is high.

## 2021-03-04 ENCOUNTER — Telehealth: Payer: Self-pay | Admitting: *Deleted

## 2021-03-04 NOTE — Telephone Encounter (Signed)
Call to patient. Per DPR can leave message on voice mail- message has number confirmation. Left message calling to touch base on scheduling procedure. Call back to [phone].

## 2021-03-09 NOTE — Telephone Encounter (Signed)
Call to patient to follow-up on plans for surgery and confirm insurance company. Left message requesting call back.

## 2021-04-06 ENCOUNTER — Encounter: Payer: Self-pay | Admitting: *Deleted

## 2021-04-19 NOTE — Telephone Encounter (Signed)
Call to patient- number has rapid "circuit signal" X 2 attempts. Letter mailed requesting patient to call office- also visible on My Chart.   Encounter closed.

## 2021-04-21 ENCOUNTER — Encounter: Payer: Self-pay | Admitting: *Deleted

## 2021-06-08 ENCOUNTER — Telehealth: Payer: Self-pay

## 2021-06-08 NOTE — Telephone Encounter (Signed)
Called patient to discuss surgery options, no answer, unable to leave message.

## 2022-01-06 ENCOUNTER — Other Ambulatory Visit: Payer: Self-pay | Admitting: Physician Assistant

## 2022-01-06 DIAGNOSIS — F32A Depression, unspecified: Secondary | ICD-10-CM

## 2022-04-03 ENCOUNTER — Other Ambulatory Visit: Payer: Self-pay | Admitting: Physician Assistant

## 2022-04-03 DIAGNOSIS — F419 Anxiety disorder, unspecified: Secondary | ICD-10-CM

## 2022-04-29 ENCOUNTER — Other Ambulatory Visit: Payer: Self-pay | Admitting: Physician Assistant

## 2022-04-29 DIAGNOSIS — F419 Anxiety disorder, unspecified: Secondary | ICD-10-CM

## 2022-05-17 ENCOUNTER — Other Ambulatory Visit: Payer: Self-pay | Admitting: Physician Assistant

## 2022-05-17 DIAGNOSIS — F32A Depression, unspecified: Secondary | ICD-10-CM
